# Patient Record
Sex: Female | Born: 1980 | Race: Black or African American | Hispanic: No | Smoking: Never smoker
Health system: Southern US, Community
[De-identification: ages and names within clinical notes are randomized; demographics above are authoritative.]

## PROBLEM LIST (undated history)

## (undated) DIAGNOSIS — M2550 Pain in unspecified joint: Secondary | ICD-10-CM

## (undated) DIAGNOSIS — K219 Gastro-esophageal reflux disease without esophagitis: Secondary | ICD-10-CM

## (undated) DIAGNOSIS — R7303 Prediabetes: Secondary | ICD-10-CM

## (undated) DIAGNOSIS — M549 Dorsalgia, unspecified: Secondary | ICD-10-CM

## (undated) DIAGNOSIS — E785 Hyperlipidemia, unspecified: Secondary | ICD-10-CM

## (undated) DIAGNOSIS — D649 Anemia, unspecified: Secondary | ICD-10-CM

## (undated) DIAGNOSIS — E669 Obesity, unspecified: Secondary | ICD-10-CM

## (undated) DIAGNOSIS — K920 Hematemesis: Secondary | ICD-10-CM

## (undated) DIAGNOSIS — R002 Palpitations: Secondary | ICD-10-CM

## (undated) DIAGNOSIS — R1084 Generalized abdominal pain: Secondary | ICD-10-CM

## (undated) DIAGNOSIS — G4700 Insomnia, unspecified: Secondary | ICD-10-CM

## (undated) HISTORY — DX: Dorsalgia, unspecified: M54.9

## (undated) HISTORY — DX: Hematemesis: K92.0

## (undated) HISTORY — DX: Prediabetes: R73.03

## (undated) HISTORY — DX: Insomnia, unspecified: G47.00

## (undated) HISTORY — DX: Hyperlipidemia, unspecified: E78.5

## (undated) HISTORY — DX: Anemia, unspecified: D64.9

## (undated) HISTORY — DX: Pain in unspecified joint: M25.50

## (undated) HISTORY — DX: Generalized abdominal pain: R10.84

## (undated) HISTORY — DX: Palpitations: R00.2

## (undated) HISTORY — DX: Obesity, unspecified: E66.9

## (undated) HISTORY — PX: CYST REMOVAL HAND: SHX6279

---
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
## 1999-10-13 ENCOUNTER — Other Ambulatory Visit: Admission: RE | Admit: 1999-10-13 | Discharge: 1999-10-13 | Payer: Self-pay | Admitting: *Deleted

## 1999-11-09 ENCOUNTER — Other Ambulatory Visit: Admission: RE | Admit: 1999-11-09 | Discharge: 1999-11-09 | Payer: Self-pay | Admitting: *Deleted

## 1999-11-10 ENCOUNTER — Other Ambulatory Visit: Admission: RE | Admit: 1999-11-10 | Discharge: 1999-11-10 | Payer: Self-pay | Admitting: Obstetrics and Gynecology

## 1999-11-10 ENCOUNTER — Encounter (INDEPENDENT_AMBULATORY_CARE_PROVIDER_SITE_OTHER): Payer: Self-pay

## 1999-11-13 ENCOUNTER — Emergency Department (HOSPITAL_COMMUNITY): Admission: EM | Admit: 1999-11-13 | Discharge: 1999-11-14 | Payer: Self-pay | Admitting: Emergency Medicine

## 2000-10-31 ENCOUNTER — Other Ambulatory Visit: Admission: RE | Admit: 2000-10-31 | Discharge: 2000-10-31 | Payer: Self-pay | Admitting: Obstetrics and Gynecology

## 2002-08-29 ENCOUNTER — Other Ambulatory Visit: Admission: RE | Admit: 2002-08-29 | Discharge: 2002-08-29 | Payer: Self-pay | Admitting: Obstetrics and Gynecology

## 2005-04-19 ENCOUNTER — Encounter: Admission: RE | Admit: 2005-04-19 | Discharge: 2005-04-19 | Payer: Self-pay | Admitting: Internal Medicine

## 2005-12-13 ENCOUNTER — Ambulatory Visit: Payer: Self-pay | Admitting: Pulmonary Disease

## 2007-02-24 ENCOUNTER — Encounter: Admission: RE | Admit: 2007-02-24 | Discharge: 2007-02-24 | Payer: Self-pay | Admitting: Internal Medicine

## 2009-05-07 ENCOUNTER — Inpatient Hospital Stay (HOSPITAL_COMMUNITY): Admission: AD | Admit: 2009-05-07 | Discharge: 2009-05-10 | Payer: Self-pay | Admitting: Obstetrics and Gynecology

## 2009-12-09 ENCOUNTER — Inpatient Hospital Stay (HOSPITAL_COMMUNITY): Admission: AD | Admit: 2009-12-09 | Discharge: 2009-05-07 | Payer: Self-pay | Admitting: Obstetrics and Gynecology

## 2010-03-22 LAB — COMPREHENSIVE METABOLIC PANEL
ALT: 16 U/L (ref 0–35)
ALT: 19 U/L (ref 0–35)
AST: 22 U/L (ref 0–37)
AST: 23 U/L (ref 0–37)
Albumin: 1.8 g/dL — ABNORMAL LOW (ref 3.5–5.2)
Albumin: 2.6 g/dL — ABNORMAL LOW (ref 3.5–5.2)
Alkaline Phosphatase: 153 U/L — ABNORMAL HIGH (ref 39–117)
Alkaline Phosphatase: 91 U/L (ref 39–117)
BUN: 10 mg/dL (ref 6–23)
BUN: 11 mg/dL (ref 6–23)
CO2: 21 mEq/L (ref 19–32)
CO2: 22 mEq/L (ref 19–32)
Calcium: 8.1 mg/dL — ABNORMAL LOW (ref 8.4–10.5)
Calcium: 9.4 mg/dL (ref 8.4–10.5)
Chloride: 105 mEq/L (ref 96–112)
Chloride: 107 mEq/L (ref 96–112)
Creatinine, Ser: 0.75 mg/dL (ref 0.4–1.2)
Creatinine, Ser: 1.03 mg/dL (ref 0.4–1.2)
GFR calc Af Amer: >60 mL/min (ref >60)
GFR calc Af Amer: >60 mL/min (ref >60)
GFR calc non Af Amer: >60 mL/min (ref >60)
GFR calc non Af Amer: >60 mL/min (ref >60)
Glucose, Bld: 87 mg/dL (ref 70–99)
Glucose, Bld: 93 mg/dL (ref 70–99)
Potassium: 4.2 mEq/L (ref 3.5–5.1)
Potassium: 4.3 mEq/L (ref 3.5–5.1)
Sodium: 134 mEq/L — ABNORMAL LOW (ref 135–145)
Sodium: 136 mEq/L (ref 135–145)
Total Bilirubin: 0.2 mg/dL — ABNORMAL LOW (ref 0.3–1.2)
Total Bilirubin: 0.3 mg/dL (ref 0.3–1.2)
Total Protein: 4.8 g/dL — ABNORMAL LOW (ref 6.0–8.3)
Total Protein: 6.6 g/dL (ref 6.0–8.3)

## 2010-03-22 LAB — CBC
HCT: 19.7 % — ABNORMAL LOW (ref 36.0–46.0)
HCT: 21.9 % — ABNORMAL LOW (ref 36.0–46.0)
HCT: 33.2 % — ABNORMAL LOW (ref 36.0–46.0)
HCT: 36 % (ref 36.0–46.0)
Hemoglobin: 11.2 g/dL — ABNORMAL LOW (ref 12.0–15.0)
Hemoglobin: 12 g/dL (ref 12.0–15.0)
Hemoglobin: 6.7 g/dL — CL (ref 12.0–15.0)
Hemoglobin: 7.3 g/dL — ABNORMAL LOW (ref 12.0–15.0)
MCHC: 33.2 g/dL (ref 30.0–36.0)
MCHC: 33.5 g/dL (ref 30.0–36.0)
MCHC: 33.7 g/dL (ref 30.0–36.0)
MCHC: 33.8 g/dL (ref 30.0–36.0)
MCV: 83.1 fL (ref 78.0–100.0)
MCV: 84 fL (ref 78.0–100.0)
MCV: 84.3 fL (ref 78.0–100.0)
MCV: 84.9 fL (ref 78.0–100.0)
Platelets: 225 10*3/uL (ref 150–400)
Platelets: 272 10*3/uL (ref 150–400)
Platelets: 313 10*3/uL (ref 150–400)
Platelets: 332 10*3/uL (ref 150–400)
RBC: 2.32 MIL/uL — ABNORMAL LOW (ref 3.87–5.11)
RBC: 2.61 MIL/uL — ABNORMAL LOW (ref 3.87–5.11)
RBC: 4 MIL/uL (ref 3.87–5.11)
RBC: 4.27 MIL/uL (ref 3.87–5.11)
RDW: 17.4 % — ABNORMAL HIGH (ref 11.5–15.5)
RDW: 17.6 % — ABNORMAL HIGH (ref 11.5–15.5)
RDW: 17.8 % — ABNORMAL HIGH (ref 11.5–15.5)
RDW: 18 % — ABNORMAL HIGH (ref 11.5–15.5)
WBC: 10.5 10*3/uL (ref 4.0–10.5)
WBC: 12.5 10*3/uL — ABNORMAL HIGH (ref 4.0–10.5)
WBC: 16.9 10*3/uL — ABNORMAL HIGH (ref 4.0–10.5)
WBC: 18.3 10*3/uL — ABNORMAL HIGH (ref 4.0–10.5)

## 2010-03-22 LAB — LACTATE DEHYDROGENASE
LDH: 149 U/L (ref 94–250)
LDH: 181 U/L (ref 94–250)

## 2010-03-22 LAB — URIC ACID
Uric Acid, Serum: 6.8 mg/dL (ref 2.4–7.0)
Uric Acid, Serum: 7.2 mg/dL — ABNORMAL HIGH (ref 2.4–7.0)

## 2010-03-22 LAB — RPR: RPR Ser Ql: NONREACTIVE

## 2010-03-22 LAB — CCBB MATERNAL DONOR DRAW

## 2010-05-20 NOTE — Assessment & Plan Note (Signed)
[facility]                             PULMONARY OFFICE NOTE   Megan Hanna, Megan Hanna                   MRN:          [id number]  DATE:12/13/2005                            DOB:          March 25, 1980    HISTORY OF PRESENT ILLNESS:  The patient is a 30 year old female who I  have been asked to see for possible obstructive sleep apnea. The patient  states that she has had intermittent spells where she will realize that  she is not breathing and will awaken but feels that she can not  physically move. This occurs very infrequently but occasionally can be 2  times a week. She has now gotten to the point where she is scared to  fall asleep. Patient typically gets to bed between 11 and 12:30 and gets  up at 8 a.m. to start her day. For the last 2 months she feels that she  has not been rested upon arising. She has been noted to have occasional  snoring but no one has mentioned pauses in her breathing during sleep.  Patient works as a Veterinary surgeon and does note sleep pressure during the  day. She has decreased concentration, and efficiency at work. She has  had some dozing with [employer] and movies but not difficulties driving. Of  note her weight is up about 50 pounds over the last 2 years.   PAST MEDICAL HISTORY:  Totally unremarkable. Patient takes only birth  control pills and has no known drug allergies.   SOCIAL HISTORY:  She is single and does not have children. She has never  smoked.   FAMILY HISTORY:  Remarkable only for heart disease.   REVIEW OF SYSTEMS:  As per present illness, also see patient intake form  documented in the chart.   PHYSICAL EXAMINATION:  IN GENERAL: She is an overweight female in no  acute distress. Blood pressure 118/70, pulse 77, temperature 99, weight  198 pounds, O2 saturation on room air is 93%.  HEENT: Pupils equal, round, and reactive to light, and accommodation.  Extraocular muscles intact. Nares are patent without  discharge.  OROPHARYNX: Shows no significant elongation of the soft palate or uvula,  otherwise it is clear.  NECK: Supple without JVD, lymphadenopathy. There is no palpable  thyromegaly.  CHEST: Totally clear.  CARDIAC EXAM: Reveals regular rate and rhythm, no murmurs, rubs, or  gallops.  ABDOMEN: Soft, nontender with good bowel sounds.  GENITALIA/RECTAL/BREAST EXAM: Not done and not indicated.  Lower extremities were without edema, good pulse distally. No calf  tenderness.  NEUROLOGICALLY: Alert and oriented with no obvious motor deficits.   IMPRESSION:  Questionable obstructive sleep apnea. The patient sounds as  if she has a possible apneic episode which probably arouses her from  rapid eye movement and results in sleep paralysis. She certainly has  daytime symptoms of being unrested and is obese and therefore may have  obstructive sleep apnea. I really think at this point in time it would  be worthwhile to do a sleep study.   PLAN:  1. Schedule for nocturnal polysomnogram.  2. Would suggest thyroid  function testing if this has not been done. I      will leave that to her primary care physician. The patient will      followup after the above.     Barbaraann Share, MD,FCCP  Electronically Signed    [facility]  DD: 01/17/2006  DT: 01/17/2006  Job #: [id number]   cc:   Luanna Cole. Lenord Fellers, M.D.

## 2013-09-29 ENCOUNTER — Ambulatory Visit: Payer: BC Managed Care – PPO

## 2015-11-02 ENCOUNTER — Encounter (HOSPITAL_COMMUNITY): Payer: Self-pay | Admitting: *Deleted

## 2015-11-02 ENCOUNTER — Ambulatory Visit (HOSPITAL_COMMUNITY)
Admission: EM | Admit: 2015-11-02 | Discharge: 2015-11-02 | Disposition: A | Discharge status: Home / Self Care | Payer: 59 | Attending: Family Medicine | Admitting: Family Medicine

## 2015-11-02 DIAGNOSIS — K2100 Gastro-esophageal reflux disease with esophagitis, without bleeding: Secondary | ICD-10-CM

## 2015-11-02 DIAGNOSIS — K449 Diaphragmatic hernia without obstruction or gangrene: Secondary | ICD-10-CM | POA: Diagnosis not present

## 2015-11-02 HISTORY — DX: Gastro-esophageal reflux disease without esophagitis: K21.9

## 2015-11-02 LAB — POCT PREGNANCY, URINE: Preg Test, Ur: NEGATIVE

## 2015-11-02 MED ORDER — GI COCKTAIL ~~LOC~~
30.0000 mL | Freq: Once | ORAL | Status: AC
  Administered 2015-11-02: 30 mL via ORAL

## 2015-11-02 MED ORDER — GI COCKTAIL ~~LOC~~
ORAL | Status: AC
  Filled 2015-11-02: qty 30

## 2015-11-02 MED ORDER — OMEPRAZOLE 20 MG PO CPDR: 20.0000 mg | DELAYED_RELEASE_CAPSULE | Freq: Every day | ORAL | 1 refills | Status: DC

## 2015-11-02 MED ORDER — HYOSCYAMINE SULFATE SL 0.125 MG SL SUBL: 1.0000 | SUBLINGUAL_TABLET | Freq: Three times a day (TID) | SUBLINGUAL | 0 refills | Status: DC | PRN

## 2015-11-02 NOTE — ED Triage Notes (Signed)
Patient reports chest pain and sob of breath, constant in nature since Friday. States it feels like someone is sitting on her chest. Patient reports headache last night. Denies any diaphoresis. Sates she has felt nauseas.

## 2015-11-02 NOTE — ED Provider Notes (Signed)
[facility]    CSN: [id number] Arrival date & time: 11/02/15  1203     History   Chief Complaint Chief Complaint  Patient presents with  . Chest Pain  . Shortness of Breath    HPI Megan Hanna is a 35 y.o. female.   This 35 year old woman who for the last 4 days is had substernal pressure that is been continuous. It is been associated with some nausea, but no diaphoresis or fever. Her symptoms are very mild at present. They do not radiate to her back.  Patient's a nonsmoker and does not have hypertension. Megan Hanna has no history of diabetes. Megan Hanna has had some mild aching in her legs but no point tenderness, swelling, or difficulty walking. Megan Hanna has no rash.  Patient has had GERD in the past but it was never associated with this much pressure on the sensation of tightness in her chest.  Patient has a history of hiatal hernia.  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      Past Medical History:  Diagnosis Date  . GERD (gastroesophageal reflux disease)     There are no active problems to display for this patient.   History reviewed. No pertinent surgical history.  OB History    No data available       Home Medications    Prior to Admission medications   Medication Sig Start Date End Date Taking? Authorizing Provider  Hyoscyamine Sulfate SL (LEVSIN/SL) 0.125 MG SUBL Place 1 tablet under the tongue 3 (three) times daily as needed. 11/02/15   Elvina SidleKurt Delphin Funes, MD  omeprazole (PRILOSEC) 20 MG capsule Take 1 capsule (20 mg total) by mouth daily. 11/02/15   Elvina SidleKurt Aleksis Jiggetts, MD    Family History History reviewed. No pertinent family history.  Social History Social History  Substance Use Topics  . Smoking status: Never Smoker  . Smokeless tobacco: Not on file  . Alcohol use No     Allergies   Review of patient's allergies indicates no known allergies.   Review of Systems Review of Systems  Constitutional: Negative.     HENT: Negative.   Eyes: Negative.   Respiratory: Positive for chest tightness.   Cardiovascular: Positive for chest pain.  Gastrointestinal: Positive for nausea. Negative for vomiting.     Physical Exam Triage Vital Signs ED Triage Vitals  Enc Vitals Group     BP 11/02/15 1220 114/76     Pulse Rate 11/02/15 1220 70     Resp 11/02/15 1220 18     Temp 11/02/15 1220 98 F (36.7 C)     Temp Source 11/02/15 1220 Oral     SpO2 11/02/15 1220 100 %     Weight --      Height --      Head Circumference --      Peak Flow --      Pain Score 11/02/15 1219 7     Pain Loc --      Pain Edu? --      Excl. in GC? --    No data found.   Updated Vital Signs BP 114/76 (BP Location: Right Arm)   Pulse 70   Temp 98 F (36.7 C) (Oral)   Resp 18   LMP 10/03/2015   SpO2 100%    Physical Exam  Constitutional: Megan Hanna is oriented to person, place, and time. Megan Hanna appears well-developed and well-nourished.  HENT:  Head: Normocephalic.  Right Ear: External ear normal.  Left  Ear: External ear normal.  Mouth/Throat: Oropharynx is clear and moist.  Eyes: Conjunctivae and EOM are normal. Pupils are equal, round, and reactive to light.  Neck: Normal range of motion. Neck supple.  Cardiovascular: Normal rate, regular rhythm and normal heart sounds.   Pulmonary/Chest: Effort normal and breath sounds normal.  Abdominal: Soft. Bowel sounds are normal. There is no tenderness.  Musculoskeletal: Normal range of motion.  Neurological: Megan Hanna is alert and oriented to person, place, and time. No cranial nerve deficit.  Skin: Skin is warm and dry.  Nursing note and vitals reviewed.    UC Treatments / Results  Labs (all labs ordered are listed, but only abnormal results are displayed) Labs Reviewed  POCT PREGNANCY, URINE    EKG  EKG Interpretation None       Radiology No results found.  Procedures ED EKG Date/Time: 11/02/2015 12:30 PM Performed by: Elvina SidleLAUENSTEIN, Attila Mccarthy Authorized by:  Elvina SidleLAUENSTEIN, Averyanna Sax   ECG reviewed by ED Physician in the absence of a cardiologist: yes   Previous ECG:    Previous ECG:  Unavailable Interpretation:    Interpretation: normal   Rate:    ECG rate assessment: normal   Rhythm:    Rhythm: sinus rhythm   Ectopy:    Ectopy: none   QRS:    QRS axis:  Normal Conduction:    Conduction: normal   ST segments:    ST segments:  Normal T waves:    T waves: normal   Other findings:    Other findings: poor R wave progression     (including critical care time)  Medications Ordered in UC Medications  gi cocktail (Maalox,Lidocaine,Donnatal) (30 mLs Oral Given 11/02/15 1256)     Initial Impression / Assessment and Plan / UC Course  I have reviewed the triage vital signs and the nursing notes.  Pertinent labs & imaging results that were available during my care of the patient were reviewed by me and considered in my medical decision making (see chart for details).  Clinical Course   After given GI cocktail, patient has significant improvement. Megan Hanna still had some discomfort.  Final Clinical Impressions(s) / UC Diagnoses   Final diagnoses:  Gastroesophageal reflux disease with esophagitis  Hiatal hernia    New Prescriptions New Prescriptions   HYOSCYAMINE SULFATE SL (LEVSIN/SL) 0.125 MG SUBL    Place 1 tablet under the tongue 3 (three) times daily as needed.   OMEPRAZOLE (PRILOSEC) 20 MG CAPSULE    Take 1 capsule (20 mg total) by mouth daily.     Elvina SidleKurt Retal Tonkinson, MD 11/02/15 [phone]

## 2016-01-17 DIAGNOSIS — K920 Hematemesis: Secondary | ICD-10-CM | POA: Diagnosis not present

## 2016-01-17 DIAGNOSIS — A084 Viral intestinal infection, unspecified: Secondary | ICD-10-CM | POA: Diagnosis not present

## 2016-03-10 DIAGNOSIS — R110 Nausea: Secondary | ICD-10-CM | POA: Diagnosis not present

## 2016-03-10 DIAGNOSIS — R1084 Generalized abdominal pain: Secondary | ICD-10-CM | POA: Diagnosis not present

## 2016-03-10 DIAGNOSIS — K920 Hematemesis: Secondary | ICD-10-CM | POA: Diagnosis not present

## 2016-03-22 DIAGNOSIS — E611 Iron deficiency: Secondary | ICD-10-CM | POA: Diagnosis not present

## 2016-03-22 DIAGNOSIS — E538 Deficiency of other specified B group vitamins: Secondary | ICD-10-CM | POA: Diagnosis not present

## 2016-03-22 DIAGNOSIS — R110 Nausea: Secondary | ICD-10-CM | POA: Diagnosis not present

## 2016-03-22 DIAGNOSIS — R1084 Generalized abdominal pain: Secondary | ICD-10-CM | POA: Diagnosis not present

## 2016-03-24 DIAGNOSIS — K2930 Chronic superficial gastritis without bleeding: Secondary | ICD-10-CM | POA: Diagnosis not present

## 2016-03-24 DIAGNOSIS — R110 Nausea: Secondary | ICD-10-CM | POA: Diagnosis not present

## 2016-03-30 ENCOUNTER — Other Ambulatory Visit: Payer: Self-pay | Admitting: Family Medicine

## 2016-03-30 DIAGNOSIS — R1011 Right upper quadrant pain: Secondary | ICD-10-CM

## 2016-03-31 ENCOUNTER — Other Ambulatory Visit: Payer: Self-pay | Admitting: Family Medicine

## 2016-03-31 DIAGNOSIS — G4459 Other complicated headache syndrome: Secondary | ICD-10-CM

## 2016-04-06 ENCOUNTER — Ambulatory Visit
Admission: RE | Admit: 2016-04-06 | Discharge: 2016-04-06 | Disposition: A | Discharge status: Home / Self Care | Payer: 59 | Source: Ambulatory Visit | Attending: Family Medicine | Admitting: Family Medicine

## 2016-04-06 DIAGNOSIS — R1011 Right upper quadrant pain: Secondary | ICD-10-CM | POA: Diagnosis not present

## 2016-04-12 ENCOUNTER — Other Ambulatory Visit (HOSPITAL_COMMUNITY): Payer: Self-pay | Admitting: Family Medicine

## 2016-04-12 DIAGNOSIS — R1011 Right upper quadrant pain: Secondary | ICD-10-CM

## 2016-04-14 ENCOUNTER — Ambulatory Visit
Admission: RE | Admit: 2016-04-14 | Discharge: 2016-04-14 | Disposition: A | Discharge status: Home / Self Care | Payer: 59 | Source: Ambulatory Visit | Attending: Family Medicine | Admitting: Family Medicine

## 2016-04-14 ENCOUNTER — Encounter: Payer: Self-pay | Admitting: Radiology

## 2016-04-14 DIAGNOSIS — G4459 Other complicated headache syndrome: Secondary | ICD-10-CM

## 2016-04-14 DIAGNOSIS — R51 Headache: Secondary | ICD-10-CM | POA: Diagnosis not present

## 2016-04-14 MED ORDER — GADOBENATE DIMEGLUMINE 529 MG/ML IV SOLN
20.0000 mL | Freq: Once | INTRAVENOUS | Status: AC | PRN
  Administered 2016-04-14: 20 mL via INTRAVENOUS

## 2016-04-18 ENCOUNTER — Telehealth: Payer: Self-pay | Admitting: *Deleted

## 2016-04-19 ENCOUNTER — Encounter (HOSPITAL_COMMUNITY): Payer: Self-pay

## 2016-04-19 ENCOUNTER — Ambulatory Visit (HOSPITAL_COMMUNITY): Payer: 59

## 2016-04-25 ENCOUNTER — Ambulatory Visit (HOSPITAL_COMMUNITY)
Admission: RE | Admit: 2016-04-25 | Discharge: 2016-04-25 | Disposition: A | Discharge status: Home / Self Care | Payer: 59 | Source: Ambulatory Visit | Attending: Family Medicine | Admitting: Family Medicine

## 2016-04-25 DIAGNOSIS — R1011 Right upper quadrant pain: Secondary | ICD-10-CM | POA: Diagnosis not present

## 2016-04-25 DIAGNOSIS — R110 Nausea: Secondary | ICD-10-CM | POA: Diagnosis not present

## 2016-04-25 MED ORDER — TECHNETIUM TC 99M MEBROFENIN IV KIT
5.0000 | PACK | Freq: Once | INTRAVENOUS | Status: AC | PRN
  Administered 2016-04-25: 5 via INTRAVENOUS

## 2016-04-26 DIAGNOSIS — E785 Hyperlipidemia, unspecified: Secondary | ICD-10-CM | POA: Diagnosis not present

## 2016-04-26 DIAGNOSIS — G4700 Insomnia, unspecified: Secondary | ICD-10-CM | POA: Diagnosis not present

## 2016-04-26 DIAGNOSIS — Z0000 Encounter for general adult medical examination without abnormal findings: Secondary | ICD-10-CM | POA: Diagnosis not present

## 2016-11-02 DIAGNOSIS — R002 Palpitations: Secondary | ICD-10-CM | POA: Diagnosis not present

## 2016-11-03 ENCOUNTER — Telehealth: Payer: Self-pay | Admitting: *Deleted

## 2016-11-03 DIAGNOSIS — R002 Palpitations: Secondary | ICD-10-CM | POA: Diagnosis not present

## 2016-11-03 NOTE — Telephone Encounter (Signed)
NOTES SENT TO SCHEDULING.  °

## 2016-11-06 DIAGNOSIS — R002 Palpitations: Secondary | ICD-10-CM

## 2016-11-06 HISTORY — DX: Palpitations: R00.2

## 2016-11-06 NOTE — Progress Notes (Signed)
Cardiology Office Note:    Date:  11/08/2016   ID:  Megan Hanna, DOB 07-01-1980, MRN [id number]  PCP:  Shirlean Mylar, MD  Cardiologist:  Norman Herrlich, MD   Referring MD: Shirlean Mylar, MD  ASSESSMENT:    1. Palpitation   2. Chest pain in adult    PLAN:    In order of problems listed above:  1. Her symptoms are quite suggestive of ventricular premature contractions.  With associated shortness of breath and palpitation she needs evaluation for cardiomyopathy as well as CAD and will undergo a stress echo as well as a Holter monitor with frequent symptoms to quantitate and typify her arrhythmia.  We discussed beta-blocker therapy if we document frequent symptomatic PVCs.  There is no obvious precipitant and I do not feel as if I need to repeat her blood test. 2. To undergo stress test stress echo  Next appointment 3 weeks   Medication Adjustments/Labs and Tests Ordered: Current medicines are reviewed at length with the patient today.  Concerns regarding medicines are outlined above.  Orders Placed This Encounter  Procedures  . Holter monitor - 48 hour  . ECHOCARDIOGRAM STRESS TEST   No orders of the defined types were placed in this encounter.    Chief Complaint  Patient presents with  . Palpitations    History of Present Illness:    Megan Hanna is a 36 y.o. female who is being seen today for the evaluation of palpitation and  chest pain at the request of Shirlean Mylar, MD. She had the onset of palpitations approximately 2-1/2 weeks ago.  She has episodes that occur daily lasting up to 5-15 minutes or pulse feels forceful she is breathless pleural moment has been a chest tightness.  The episodes occur at rest not with exertion occur throughout the day but more frequently in the evening they do not awaken her from her sleep and she does not feel as if her pulse is particularly chaotic a rapid just forceful.  She takes no proarrhythmic over-the-counter medications and  does not feel particularly under stress at this time.  She has not had no exercise intolerance exertional chest pain syncope and has no known history of congenital or rheumatic heart disease.  Her only medication is trazodone for sleep and she takes once every few months and no longer takes a PPI and no longer has GERD symptoms since assuming a vegetarian diet  Past Medical History:  Diagnosis Date  . Generalized abdominal pain 11/07/2016  . GERD (gastroesophageal reflux disease)   . Hematemesis with nausea 11/07/2016  . Hyperlipidemia 11/07/2016  . Insomnia 11/07/2016  . Obesity 11/07/2016  . Palpitation 11/06/2016    Past Surgical History:  Procedure Laterality Date  . CYST REMOVAL HAND Left     Current Medications: No outpatient medications have been marked as taking for the 11/08/16 encounter (Office Visit) with Baldo Daub, MD.     Allergies:   Patient has no known allergies.   Social History   Socioeconomic History  . Marital status: Married    Spouse name: None  . Number of children: None  . Years of education: None  . Highest education level: None  Social Needs  . Financial resource strain: None  . Food insecurity - worry: None  . Food insecurity - inability: None  . Transportation needs - medical: None  . Transportation needs - non-medical: None  Occupational History  . None  Tobacco Use  . Smoking status: Never Smoker  .  Smokeless tobacco: Never Used  Substance and Sexual Activity  . Alcohol use: Yes    Comment: occasional wine  . Drug use: None  . Sexual activity: None  Other Topics Concern  . None  Social History Narrative  . None     Family History: The patient's family history includes Breast cancer in her maternal grandmother; Diabetes in her father; Heart attack in her maternal grandfather; Heart disease in her maternal grandfather and paternal grandfather; Hypertension in her father and mother.  ROS:   Review of Systems  Constitution: Negative.   HENT: Negative.   Eyes: Negative.   Cardiovascular: Positive for chest pain (vague tightness substernal associated with palpitation) and palpitations. Negative for claudication, cyanosis, dyspnea on exertion, irregular heartbeat, leg swelling, near-syncope, orthopnea and paroxysmal nocturnal dyspnea.  Respiratory: Positive for shortness of breath (with palpitation). Negative for cough, hemoptysis, sleep disturbances due to breathing, snoring, sputum production and wheezing.   Endocrine: Negative.   Hematologic/Lymphatic: Negative.   Skin: Negative.   Musculoskeletal: Negative.   Gastrointestinal: Negative.   Genitourinary: Negative.   Neurological: Negative.   Psychiatric/Behavioral: Negative.   Allergic/Immunologic: Negative.    Please see the history of present illness.     All other systems reviewed and are negative.  EKGs/Labs/Other Studies Reviewed:    The following studies were reviewed today: I reviewed her PCP office note EKG and labs prior to visit  EKG:  EKG 11/02/16 sinus rhythm normal EKG QT interval is normal  Recent Labs: 03/29/16: CBC is normal, ferritin low at 74, B12 low at 178, CMP normal K 4.5 11/03/16 CBC is normal, No results found for requested labs within last 8760 hours.  Recent Lipid Panel 09/21/14 Chol 209 HDL 52 LDL143 No results found for: CHOL, TRIG, HDL, CHOLHDL, VLDL, LDLCALC, LDLDIRECT  Physical Exam:    VS:  BP 90/78 (BP Location: Left Arm, Patient Position: Sitting, Cuff Size: Large)   Pulse 87   Ht 5\' 7"  (1.702 m)   Wt 232 lb (105.2 kg)   SpO2 98%   BMI 36.34 kg/m     Wt Readings from Last 3 Encounters:  11/08/16 232 lb (105.2 kg)     GEN:  Well nourished, well developed in no acute distress HEENT: Normal NECK: No JVD; No carotid bruits LYMPHATICS: No lymphadenopathy CARDIAC: RRR, no murmurs, rubs, gallops RESPIRATORY:  Clear to auscultation without rales, wheezing or rhonchi  ABDOMEN: Soft, non-tender,  non-distended MUSCULOSKELETAL:  No edema; No deformity  SKIN: Warm and dry NEUROLOGIC:  Alert and oriented x 3 PSYCHIATRIC:  Normal affect     Signed, Norman HerrlichBrian Munley, MD  11/08/2016 12:51 PM    [facility]

## 2016-11-07 DIAGNOSIS — R1084 Generalized abdominal pain: Secondary | ICD-10-CM

## 2016-11-07 DIAGNOSIS — E785 Hyperlipidemia, unspecified: Secondary | ICD-10-CM

## 2016-11-07 DIAGNOSIS — G4700 Insomnia, unspecified: Secondary | ICD-10-CM

## 2016-11-07 DIAGNOSIS — E669 Obesity, unspecified: Secondary | ICD-10-CM

## 2016-11-07 DIAGNOSIS — K920 Hematemesis: Secondary | ICD-10-CM

## 2016-11-07 DIAGNOSIS — K219 Gastro-esophageal reflux disease without esophagitis: Secondary | ICD-10-CM | POA: Insufficient documentation

## 2016-11-07 HISTORY — DX: Obesity, unspecified: E66.9

## 2016-11-07 HISTORY — DX: Generalized abdominal pain: R10.84

## 2016-11-07 HISTORY — DX: Insomnia, unspecified: G47.00

## 2016-11-07 HISTORY — DX: Hematemesis: K92.0

## 2016-11-07 HISTORY — DX: Hyperlipidemia, unspecified: E78.5

## 2016-11-08 ENCOUNTER — Ambulatory Visit: Payer: 59 | Admitting: Cardiology

## 2016-11-08 ENCOUNTER — Encounter: Payer: Self-pay | Admitting: Cardiology

## 2016-11-08 DIAGNOSIS — R079 Chest pain, unspecified: Secondary | ICD-10-CM

## 2016-11-08 DIAGNOSIS — R002 Palpitations: Secondary | ICD-10-CM | POA: Diagnosis not present

## 2016-11-08 NOTE — Patient Instructions (Addendum)
Medication Instructions:  Your physician has recommended you make the following change in your medication:  START multivitamin with iron  Labwork: None  Testing/Procedures: Your physician has recommended that you wear a holter monitor. Holter monitors are medical devices that record the heart's electrical activity. Doctors most often use these monitors to diagnose arrhythmias. Arrhythmias are problems with the speed or rhythm of the heartbeat. The monitor is a small, portable device. You can wear one while you do your normal daily activities. This is usually used to diagnose what is causing palpitations/syncope (passing out). 48 hours.  Your physician has requested that you have a stress echocardiogram. For further information please visit [email or web address]. Please follow instruction sheet as given.  Follow-Up: Your physician recommends that you schedule a follow-up appointment in: 3 weeks.  Any Other Special Instructions Will Be Listed Below (If Applicable).     If you need a refill on your cardiac medications before your next appointment, please call your pharmacy.    1. Avoid all over-the-counter antihistamines except Claritin/Loratadine and Zyrtec/Cetrizine. 2. Avoid all combination including cold sinus allergies flu decongestant and sleep medications 3. You can use Robitussin DM Mucinex and Mucinex DM for cough. 4. can use Tylenol aspirin ibuprofen and naproxen but no combinations such as sleep or sinus.

## 2016-11-13 ENCOUNTER — Ambulatory Visit: Payer: 59

## 2016-11-13 DIAGNOSIS — R002 Palpitations: Secondary | ICD-10-CM | POA: Diagnosis not present

## 2016-11-13 DIAGNOSIS — R079 Chest pain, unspecified: Secondary | ICD-10-CM | POA: Diagnosis not present

## 2016-11-30 ENCOUNTER — Ambulatory Visit (HOSPITAL_BASED_OUTPATIENT_CLINIC_OR_DEPARTMENT_OTHER): Payer: 59 | Attending: Cardiology

## 2016-12-01 ENCOUNTER — Ambulatory Visit: Payer: 59 | Admitting: Cardiology

## 2016-12-04 NOTE — Progress Notes (Deleted)
Cardiology Office Note:    Date:  12/04/2016   ID:  Megan Hanna, DOB 1980-09-27, MRN [id number]  PCP:  Shirlean MylarWebb, Carol, MD  Cardiologist:  Norman HerrlichBrian Avory Mimbs, MD    Referring MD: Shirlean MylarWebb, Carol, MD    ASSESSMENT:    No diagnosis found. PLAN:    In order of problems listed above:  1. ***   Next appointment: ***   Medication Adjustments/Labs and Tests Ordered: Current medicines are reviewed at length with the patient today.  Concerns regarding medicines are outlined above.  No orders of the defined types were placed in this encounter.  No orders of the defined types were placed in this encounter.   No chief complaint on file.   History of Present Illness:    Megan Hanna is a 36 y.o. female with a hx ofpalpitation and  chest pain last seen 1 month ago. 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 Compliance with diet, lifestyle and medications: *** Past Medical History:  Diagnosis Date  . Generalized abdominal pain 11/07/2016  . GERD (gastroesophageal reflux disease)   . Hematemesis with nausea 11/07/2016  . Hyperlipidemia 11/07/2016  . Insomnia 11/07/2016  . Obesity 11/07/2016  . Palpitation 11/06/2016    Past Surgical History:  Procedure Laterality Date  . CYST REMOVAL HAND Left     Current Medications: No outpatient medications have been marked as taking for the 12/05/16 encounter (Appointment) with Baldo DaubMunley, Olamide Carattini J, MD.     Allergies:   Patient has no known allergies.   Social History   Socioeconomic History  . Marital status: Divorced    Spouse name: Not on file  . Number of children: Not on file  . Years of education: Not on file  . Highest education level: Not on file  Social Needs  . Financial resource strain: Not on file  . Food insecurity - worry: Not on file  . Food insecurity - inability: Not on file  . Transportation needs - medical: Not on file  . Transportation needs - non-medical: Not on file    Occupational History  . Not on file  Tobacco Use  . Smoking status: Never Smoker  . Smokeless tobacco: Never Used  Substance and Sexual Activity  . Alcohol use: Yes    Comment: occasional wine  . Drug use: Not on file  . Sexual activity: Not on file  Other Topics Concern  . Not on file  Social History Narrative  . Not on file     Family History: The patient's ***family history includes Breast cancer in her maternal grandmother; Diabetes in her father; Heart attack in her maternal grandfather; Heart disease in her maternal grandfather and paternal grandfather; Hypertension in her father and mother. ROS:   Please see the history of present illness.    All other systems reviewed and are negative.  EKGs/Labs/Other Studies Reviewed:    The following studies were reviewed today:  EKG:  EKG ordered today.  The ekg ordered today demonstrates ***  Recent Labs: No results found for requested labs within last 8760 hours.  Recent Lipid Panel No results found for: CHOL, TRIG, HDL, CHOLHDL, VLDL, LDLCALC, LDLDIRECT  Physical Exam:    VS:  There were no vitals taken for this visit.    Wt Readings from Last 3 Encounters:  11/08/16 232 lb (105.2 kg)     GEN: *** Well nourished, well developed in no acute distress HEENT: Normal NECK: No JVD; No carotid bruits LYMPHATICS: No  lymphadenopathy CARDIAC: ***RRR, no murmurs, rubs, gallops RESPIRATORY:  Clear to auscultation without rales, wheezing or rhonchi  ABDOMEN: Soft, non-tender, non-distended MUSCULOSKELETAL:  No edema; No deformity  SKIN: Warm and dry NEUROLOGIC:  Alert and oriented x 3 PSYCHIATRIC:  Normal affect    Signed, Norman HerrlichBrian Jonthan Leite, MD  12/04/2016 8:44 AM    [facility]

## 2016-12-05 ENCOUNTER — Ambulatory Visit: Payer: 59 | Admitting: Cardiology

## 2017-04-20 DIAGNOSIS — M9902 Segmental and somatic dysfunction of thoracic region: Secondary | ICD-10-CM | POA: Diagnosis not present

## 2017-04-20 DIAGNOSIS — M9901 Segmental and somatic dysfunction of cervical region: Secondary | ICD-10-CM | POA: Diagnosis not present

## 2017-04-20 DIAGNOSIS — M9903 Segmental and somatic dysfunction of lumbar region: Secondary | ICD-10-CM | POA: Diagnosis not present

## 2017-04-23 DIAGNOSIS — M9902 Segmental and somatic dysfunction of thoracic region: Secondary | ICD-10-CM | POA: Diagnosis not present

## 2017-04-23 DIAGNOSIS — M9901 Segmental and somatic dysfunction of cervical region: Secondary | ICD-10-CM | POA: Diagnosis not present

## 2017-04-23 DIAGNOSIS — M9903 Segmental and somatic dysfunction of lumbar region: Secondary | ICD-10-CM | POA: Diagnosis not present

## 2017-04-26 ENCOUNTER — Other Ambulatory Visit: Payer: Self-pay

## 2017-04-26 ENCOUNTER — Ambulatory Visit (HOSPITAL_COMMUNITY)
Admission: EM | Admit: 2017-04-26 | Discharge: 2017-04-26 | Disposition: A | Discharge status: Home / Self Care | Payer: 59 | Attending: Internal Medicine | Admitting: Internal Medicine

## 2017-04-26 ENCOUNTER — Encounter (HOSPITAL_COMMUNITY): Payer: Self-pay | Admitting: Emergency Medicine

## 2017-04-26 DIAGNOSIS — J3489 Other specified disorders of nose and nasal sinuses: Secondary | ICD-10-CM

## 2017-04-26 DIAGNOSIS — M9902 Segmental and somatic dysfunction of thoracic region: Secondary | ICD-10-CM | POA: Diagnosis not present

## 2017-04-26 DIAGNOSIS — M9901 Segmental and somatic dysfunction of cervical region: Secondary | ICD-10-CM | POA: Diagnosis not present

## 2017-04-26 DIAGNOSIS — M9903 Segmental and somatic dysfunction of lumbar region: Secondary | ICD-10-CM | POA: Diagnosis not present

## 2017-04-26 DIAGNOSIS — R0981 Nasal congestion: Secondary | ICD-10-CM

## 2017-04-26 MED ORDER — PREDNISONE 20 MG PO TABS: 40.0000 mg | ORAL_TABLET | Freq: Every day | ORAL | 0 refills | Status: AC

## 2017-04-26 MED ORDER — BENZONATATE 100 MG PO CAPS: 100.0000 mg | ORAL_CAPSULE | Freq: Three times a day (TID) | ORAL | 0 refills | Status: DC

## 2017-04-26 MED ORDER — TRIAMCINOLONE ACETONIDE 55 MCG/ACT NA AERO: 2.0000 | INHALATION_SPRAY | Freq: Every day | NASAL | 12 refills | Status: DC

## 2017-04-26 NOTE — Discharge Instructions (Addendum)
Tessalon for cough. Continue allegra-D. Start nasacort for nasal congestion/drainage. You can use over the counter nasal saline rinse such as neti pot for nasal congestion. Keep hydrated, your urine should be clear to pale yellow in color. Tylenol/motrin for fever and pain. Monitor for any worsening of symptoms, chest pain, shortness of breath, wheezing, swelling of the throat, follow up for reevaluation.   If sinus pressure does not improve, you can fill prednisone to help with symptoms. As discussed, you can also use afrin to help with nasal congestion. Use sparingly, and do not use for moe than 3 days consecutively as it can cause rebound congestion.  For sore throat/cough try using a honey-based tea. Use 3 teaspoons of honey with juice squeezed from half lemon. Place shaved pieces of ginger into 1/2-1 cup of water and warm over stove top. Then mix the ingredients and repeat every 4 hours as needed.

## 2017-04-26 NOTE — ED Triage Notes (Signed)
Onset 2 days ago of symptoms.  Patient complains of tightness and burning in face, around eyes.  Head congestion, throat phlegm and laryngitis.  Overall, feeling very drained

## 2017-04-26 NOTE — ED Provider Notes (Signed)
[facility]    CSN: [id number] Arrival date & time: 04/26/17  1513     History   Chief Complaint Chief Complaint  Patient presents with  . URI    HPI Megan Hanna is a 37 y.o. female.   37 year old female comes in for 2-day history of URI symptoms.  She has had nasal congestion, rhinorrhea, burning sensation in nostril, sinus pressure. States has not been able to sleep due to nasal congestion. Cough. Denies fever, chills, night sweats. Endorses sneezing. otc mucinex, allegra, vics vapor, nyquil without relief. Never smoker.      Past Medical History:  Diagnosis Date  . Generalized abdominal pain 11/07/2016  . GERD (gastroesophageal reflux disease)   . Hematemesis with nausea 11/07/2016  . Hyperlipidemia 11/07/2016  . Insomnia 11/07/2016  . Obesity 11/07/2016  . Palpitation 11/06/2016    Patient Active Problem List   Diagnosis Date Noted  . Chest pain in adult 11/08/2016  . Hyperlipidemia 11/07/2016  . Insomnia 11/07/2016  . Obesity 11/07/2016  . Generalized abdominal pain 11/07/2016  . Hematemesis with nausea 11/07/2016  . GERD (gastroesophageal reflux disease)   . Palpitation 11/06/2016    Past Surgical History:  Procedure Laterality Date  . CYST REMOVAL HAND Left     OB History   None      Home Medications    Prior to Admission medications   Medication Sig Start Date End Date Taking? Authorizing Provider  fexofenadine-pseudoephedrine (ALLEGRA-D 24) 180-240 MG 24 hr tablet Take 1 tablet by mouth daily.   Yes [provider]  guaiFENesin (MUCINEX) 600 MG 12 hr tablet Take by mouth 2 (two) times daily.   Yes [provider]  benzonatate (TESSALON) 100 MG capsule Take 1 capsule (100 mg total) by mouth every 8 (eight) hours. 04/26/17   Cathie HoopsYu, Amy V, PA-C  predniSONE (DELTASONE) 20 MG tablet Take 2 tablets (40 mg total) by mouth daily for 5 days. 04/26/17 05/01/17  Cathie HoopsYu, Amy V, PA-C  triamcinolone (NASACORT) 55 MCG/ACT AERO nasal  inhaler Place 2 sprays into the nose daily. 04/26/17   Belinda FisherYu, Amy V, PA-C    Family History Family History  Problem Relation Age of Onset  . Hypertension Mother   . Diabetes Father   . Hypertension Father   . Breast cancer Maternal Grandmother        remission  . Heart disease Maternal Grandfather   . Heart attack Maternal Grandfather   . Heart disease Paternal Grandfather     Social History Social History   Tobacco Use  . Smoking status: Never Smoker  . Smokeless tobacco: Never Used  Substance Use Topics  . Alcohol use: Yes    Comment: occasional wine  . Drug use: Never     Allergies   Patient has no known allergies.   Review of Systems Review of Systems  Reason unable to perform ROS: See HPI as above.     Physical Exam Triage Vital Signs ED Triage Vitals  Enc Vitals Group     BP 04/26/17 1550 110/82     Pulse Rate 04/26/17 1550 90     Resp 04/26/17 1550 20     Temp 04/26/17 1550 98.2 F (36.8 C)     Temp Source 04/26/17 1550 Oral     SpO2 04/26/17 1550 99 %     Weight --      Height --      Head Circumference --      Peak Flow --  Pain Score 04/26/17 1549 7     Pain Loc --      Pain Edu? --      Excl. in GC? --    No data found.  Updated Vital Signs BP 110/82 (BP Location: Right Arm) Comment (BP Location): large cuff  Pulse 90   Temp 98.2 F (36.8 C) (Oral)   Resp 20   SpO2 99%   Physical Exam  Constitutional: She is oriented to person, place, and time. She appears well-developed and well-nourished. No distress.  HENT:  Head: Normocephalic and atraumatic.  Right Ear: External ear and ear canal normal. Tympanic membrane is erythematous. Tympanic membrane is not bulging.  Left Ear: External ear and ear canal normal. Tympanic membrane is erythematous. Tympanic membrane is not bulging.  Nose: Right sinus exhibits maxillary sinus tenderness and frontal sinus tenderness. Left sinus exhibits maxillary sinus tenderness and frontal sinus tenderness.   Mouth/Throat: Uvula is midline, oropharynx is clear and moist and mucous membranes are normal.  Eyes: Pupils are equal, round, and reactive to light. Conjunctivae are normal.  Neck: Normal range of motion. Neck supple.  Cardiovascular: Normal rate, regular rhythm and normal heart sounds. Exam reveals no gallop and no friction rub.  No murmur heard. Pulmonary/Chest: Effort normal and breath sounds normal. She has no decreased breath sounds. She has no wheezes. She has no rhonchi. She has no rales.  Lymphadenopathy:    She has no cervical adenopathy.  Neurological: She is alert and oriented to person, place, and time.  Skin: Skin is warm and dry.  Psychiatric: She has a normal mood and affect. Her behavior is normal. Judgment normal.     UC Treatments / Results  Labs (all labs ordered are listed, but only abnormal results are displayed) Labs Reviewed - No data to display  EKG None Radiology No results found.  Procedures Procedures (including critical care time)  Medications Ordered in UC Medications - No data to display   Initial Impression / Assessment and Plan / UC Course  I have reviewed the triage vital signs and the nursing notes.  Pertinent labs & imaging results that were available during my care of the patient were reviewed by me and considered in my medical decision making (see chart for details).    Discussed with patient history and exam most consistent with seasonal allergies vs viral URI. Symptomatic treatment as needed. Rx of prednisone sent to pharmacy, patient can fill and start if nasal spray does not resolve sinus pressure. Push fluids. Return precautions given.    Final Clinical Impressions(s) / UC Diagnoses   Final diagnoses:  Sinus pressure  Nasal congestion    ED Discharge Orders        Ordered    triamcinolone (NASACORT) 55 MCG/ACT AERO nasal inhaler  Daily     04/26/17 1623    benzonatate (TESSALON) 100 MG capsule  Every 8 hours     04/26/17  1623    predniSONE (DELTASONE) 20 MG tablet  Daily     04/26/17 1623         [address] Amy V, PA-C 04/26/17 1640

## 2017-05-03 DIAGNOSIS — M9902 Segmental and somatic dysfunction of thoracic region: Secondary | ICD-10-CM | POA: Diagnosis not present

## 2017-05-03 DIAGNOSIS — M9903 Segmental and somatic dysfunction of lumbar region: Secondary | ICD-10-CM | POA: Diagnosis not present

## 2017-05-03 DIAGNOSIS — M9901 Segmental and somatic dysfunction of cervical region: Secondary | ICD-10-CM | POA: Diagnosis not present

## 2017-10-30 ENCOUNTER — Other Ambulatory Visit: Payer: Self-pay

## 2017-10-30 ENCOUNTER — Encounter (HOSPITAL_BASED_OUTPATIENT_CLINIC_OR_DEPARTMENT_OTHER): Payer: Self-pay

## 2017-10-30 ENCOUNTER — Emergency Department (HOSPITAL_COMMUNITY): Admission: EM | Admit: 2017-10-30 | Payer: 59 | Source: Home / Self Care

## 2017-10-30 DIAGNOSIS — B084 Enteroviral vesicular stomatitis with exanthem: Secondary | ICD-10-CM | POA: Insufficient documentation

## 2017-10-30 DIAGNOSIS — Z79899 Other long term (current) drug therapy: Secondary | ICD-10-CM | POA: Insufficient documentation

## 2017-10-30 DIAGNOSIS — J029 Acute pharyngitis, unspecified: Secondary | ICD-10-CM | POA: Diagnosis present

## 2017-10-30 DIAGNOSIS — J020 Streptococcal pharyngitis: Secondary | ICD-10-CM | POA: Insufficient documentation

## 2017-10-30 NOTE — ED Triage Notes (Signed)
C/o rash to hands and feet this am and sore throat yesterday-was seen at [facility]-dx with strep-rx amoxil-states she is here because rash is worse-NAD-steady gait

## 2017-10-31 ENCOUNTER — Emergency Department (HOSPITAL_BASED_OUTPATIENT_CLINIC_OR_DEPARTMENT_OTHER)
Admission: EM | Admit: 2017-10-31 | Discharge: 2017-10-31 | Disposition: A | Discharge status: Home / Self Care | Payer: 59 | Attending: Emergency Medicine | Admitting: Emergency Medicine

## 2017-10-31 DIAGNOSIS — J020 Streptococcal pharyngitis: Secondary | ICD-10-CM

## 2017-10-31 DIAGNOSIS — B084 Enteroviral vesicular stomatitis with exanthem: Secondary | ICD-10-CM

## 2017-10-31 NOTE — ED Provider Notes (Signed)
[facility] Provider Note: Megan Dell, MD, FACEP  CSN: [id number] MRN: [id number] ARRIVAL: 10/30/17 at 2217 ROOM: [facility]10/[facility]   CHIEF COMPLAINT  Rash   HISTORY OF PRESENT ILLNESS  10/31/17 1:05 AM Megan Hanna is a 37 y.o. female with a 2-day history of a sore throat.  She was seen at an urgent care yesterday and diagnosed with strep by PCR.  She was placed on amoxicillin.  Yesterday, before taking her first dose of amoxicillin, she noticed a rash on her hands and feet.  The rash is maculopapular and is associated with tingling and some mild edema.  Symptoms are mild to moderate.  She has had a fever of about 100.  Her sore throat is moderate.  She also has a few bumps on her face as well.   Past Medical History:  Diagnosis Date  . Generalized abdominal pain 11/07/2016  . GERD (gastroesophageal reflux disease)   . Hematemesis with nausea 11/07/2016  . Hyperlipidemia 11/07/2016  . Insomnia 11/07/2016  . Obesity 11/07/2016  . Palpitation 11/06/2016    Past Surgical History:  Procedure Laterality Date  . CYST REMOVAL HAND Left     Family History  Problem Relation Age of Onset  . Hypertension Mother   . Diabetes Father   . Hypertension Father   . Breast cancer Maternal Grandmother        remission  . Heart disease Maternal Grandfather   . Heart attack Maternal Grandfather   . Heart disease Paternal Grandfather     Social History   Tobacco Use  . Smoking status: Never Smoker  . Smokeless tobacco: Never Used  Substance Use Topics  . Alcohol use: Yes    Comment: occ  . Drug use: Never    Prior to Admission medications   Medication Sig Start Date End Date Taking? Authorizing Provider  benzonatate (TESSALON) 100 MG capsule Take 1 capsule (100 mg total) by mouth every 8 (eight) hours. 04/26/17   Cathie Hoops, Amy V, PA-C  fexofenadine-pseudoephedrine (ALLEGRA-D 24) 180-240 MG 24 hr tablet Take 1 tablet by mouth daily.    [provider]  guaiFENesin  (MUCINEX) 600 MG 12 hr tablet Take by mouth 2 (two) times daily.    [provider]  triamcinolone (NASACORT) 55 MCG/ACT AERO nasal inhaler Place 2 sprays into the nose daily. 04/26/17   Belinda Fisher, PA-C    Allergies Patient has no known allergies.   REVIEW OF SYSTEMS  Negative except as noted here or in the History of Present Illness.   PHYSICAL EXAMINATION  Initial Vital Signs Blood pressure (!) 132/104, pulse 85, temperature 99 F (37.2 C), temperature source Oral, resp. rate 18, height 5\' 8"  (1.727 m), weight 115.7 kg, last menstrual period 10/25/2017, SpO2 100 %.  Examination General: Well-developed, well-nourished female in no acute distress; appearance consistent with age of record HENT: normocephalic; atraumatic; no intraoral lesions; mild pharyngeal erythema without exudate Eyes: pupils equal, round and reactive to light; extraocular muscles intact Neck: supple; mild anterior cervical lymphadenopathy Heart: regular rate and rhythm Lungs: clear to auscultation bilaterally Abdomen: soft; nondistended; nontender; bowel sounds present Extremities: No deformity; full range of motion Neurologic: Awake, alert and oriented; motor function intact in all extremities and symmetric; no facial droop Skin: Warm and dry; maculopapular rash of hands and feet with relative sparing of the palms and soles, few lesions of the perioral region:      Psychiatric: Normal mood and affect   RESULTS  Summary of this  visit's results, reviewed by myself:   EKG Interpretation  Date/Time:    Ventricular Rate:    PR Interval:    QRS Duration:   QT Interval:    QTC Calculation:   R Axis:     Text Interpretation:        Laboratory Studies: No results found for this or any previous visit (from the past 24 hour(s)). Imaging Studies: No results found.  ED COURSE and MDM  Nursing notes and initial vitals signs, including pulse oximetry, reviewed.  Vitals:   10/30/17 2230  10/30/17 2231  BP:  (!) 132/104  Pulse:  85  Resp:  18  Temp:  99 F (37.2 C)  TempSrc:  Oral  SpO2:  100%  Weight: 115.7 kg   Height: 5\' 8"  (1.727 m)    Rash is unlikely to be due to amoxicillin as it preceded her initiation of amoxicillin.  It is most consistent with hand, foot and mouth disease although it is atypical to spare the palms and soles.  She was advised to take over-the-counter NSAIDs for discomfort and to continue the amoxicillin.  PROCEDURES    ED DIAGNOSES     ICD-10-CM   1. Hand, foot and mouth disease B08.4   2. Strep pharyngitis J02.0        Dorthey Depace, MD 10/31/17 [phone]

## 2017-10-31 NOTE — ED Notes (Signed)
Patient is A&Ox4.  No signs of distress noted.  Please see providers complete history and physical exam.  

## 2017-10-31 NOTE — ED Notes (Signed)
PT states understanding of care given, follow up care. PT ambulated from ED to car with a steady gait.  

## 2018-07-22 IMAGING — MR MR HEAD WO/W CM
12 series · 48 of 48 positions shown · IV contrast (multihance)
Comparison: Brain MRI 02/24/2007

CLINICAL DATA: Headaches

EXAM:
MRI HEAD WITHOUT AND WITH CONTRAST
TECHNIQUE: Multiplanar, multiecho pulse sequences of the brain and surrounding
structures were obtained without and with intravenous contrast.
CONTRAST:  20mL MULTIHANCE GADOBENATE DIMEGLUMINE 529 MG/ML IV SOLN

[Series 2: t1_se_sag · sagittal · 5.0mm · 0.45mm/px · 1 of 21 slices shown]
[im 1/21]
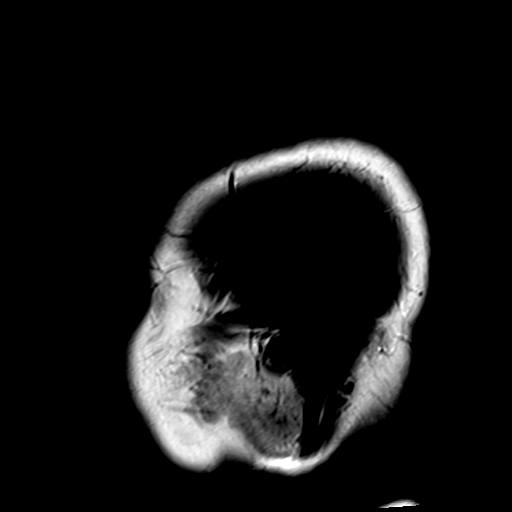

[Series 3: ep2d_diff_(id)_trace · axial · 3.0mm · 1.80mm/px · z∈[-54,+87]mm · 6 of 95 slices shown]
[im 1/95]
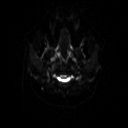
[im 19/95]
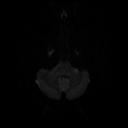
[im 38/95]
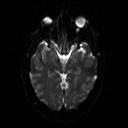
[im 57/95]
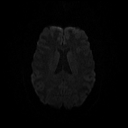
[im 76/95]
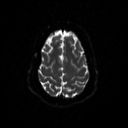
[im 95/95]
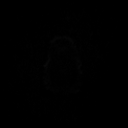

[Series 4: ep2d_diff_(id)_trace_adc · axial · 3.0mm · 1.80mm/px · z∈[-54,+87]mm · 4 of 48 slices shown]
[im 1/48]
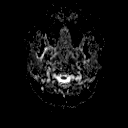
[im 16/48]
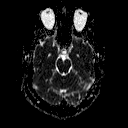
[im 32/48]
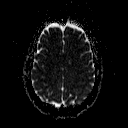
[im 48/48]
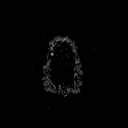

[Series 5: ep2d_diff_cor · coronal · 5.0mm · 1.77mm/px · 3 of 46 slices shown]
[im 1/46]
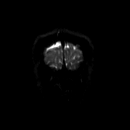
[im 23/46]
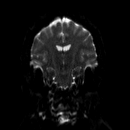
[im 46/46]
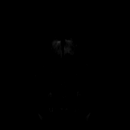

[Series 6: ep2d_diff_cor_adc · coronal · 5.0mm · 1.77mm/px · 2 of 24 slices shown]
[im 1/24]
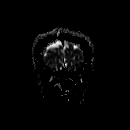
[im 24/24]
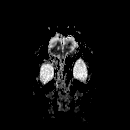

[Series 8: swi_images · axial · 5.0mm · 0.90mm/px · z∈[-56,+89]mm · 2 of 30 slices shown]
[im 1/30]
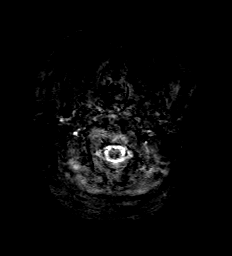
[im 30/30]
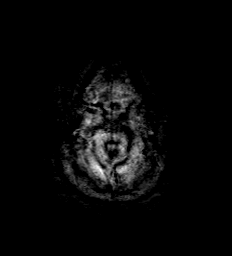

[Series 9: FLAIR · axial · 3.0mm · 0.43mm/px · z∈[-52,+86]mm · 2 of 24 slices shown]
[im 1/24]
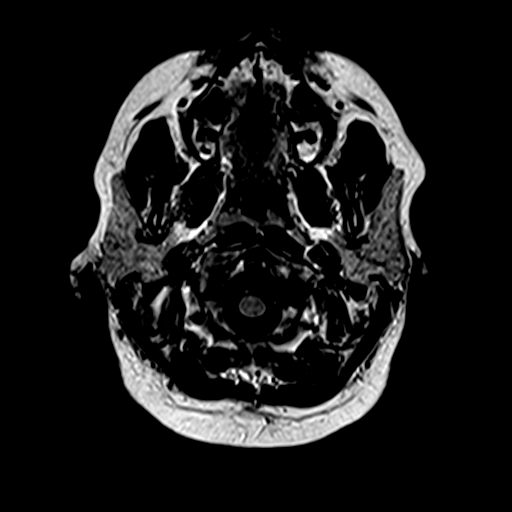
[im 24/24]
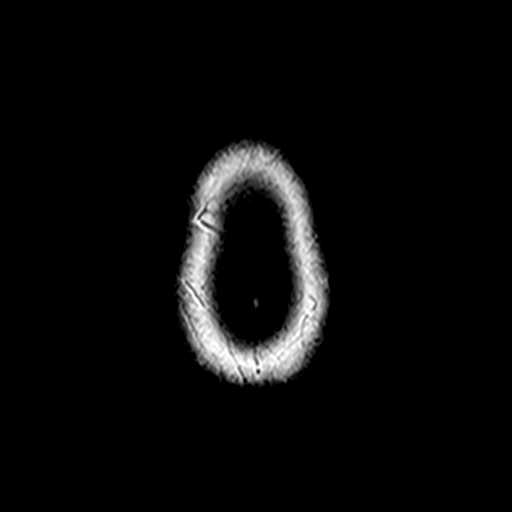

[Series 10: t2_tse_tra_512 · axial · 5.0mm · 0.60mm/px · z∈[-60,+94]mm · 2 of 23 slices shown]
[im 1/23]
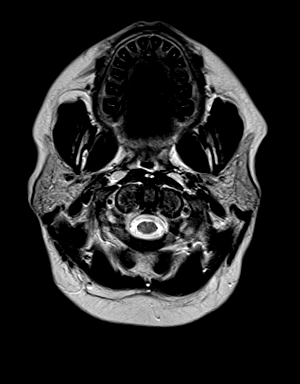
[im 23/23]
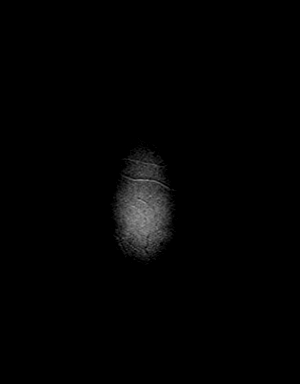

[Series 11: t1_mpr_tra · axial · 1.0mm · 0.72mm/px · z∈[-55,+88]mm · 11 of 144 slices shown]
[im 1/144]
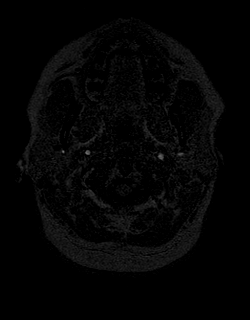
[im 15/144]
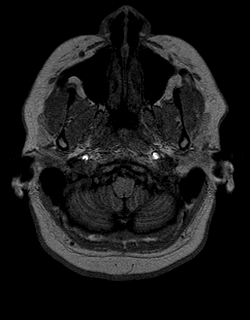
[im 29/144]
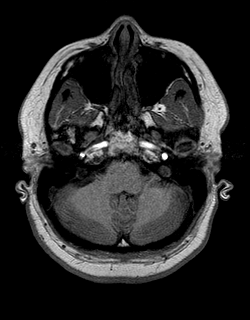
[im 43/144]
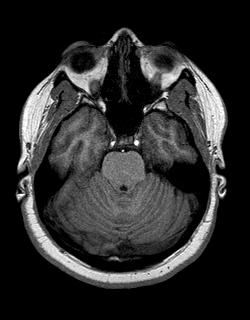
[im 58/144]
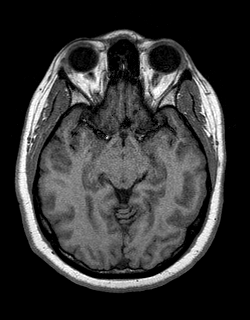
[im 72/144]
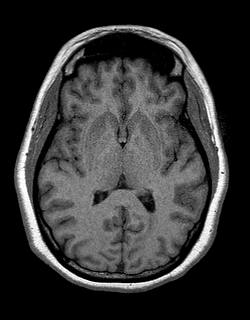
[im 86/144]
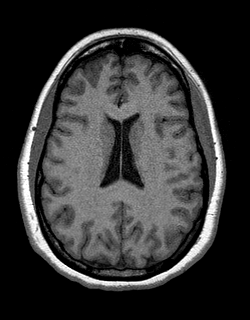
[im 101/144]
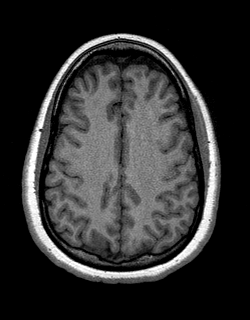
[im 115/144]
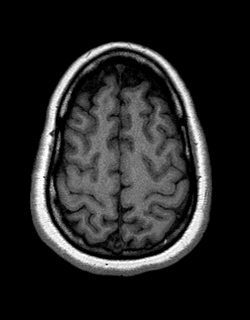
[im 129/144]
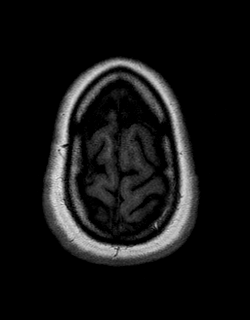
[im 144/144]
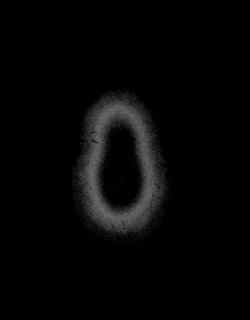

[Series 12: T2 · coronal · 5.0mm · 0.45mm/px · 2 of 25 slices shown]
[im 1/25]
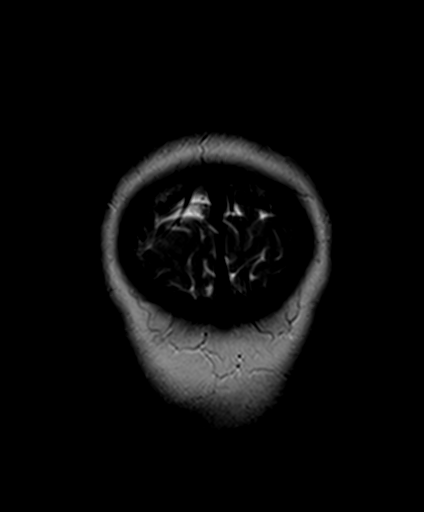
[im 25/25]
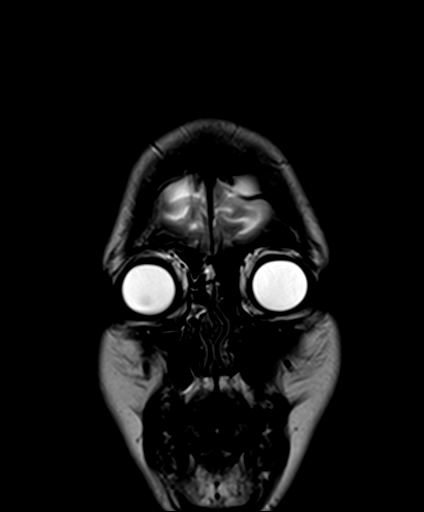

[Series 13: T1 post-contrast · coronal · 5.0mm · 0.72mm/px · 2 of 25 slices shown]
[im 1/25]
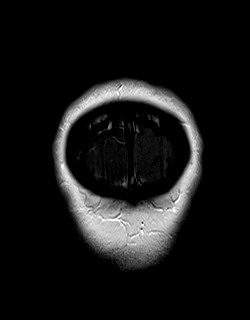
[im 25/25]
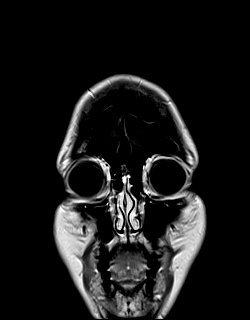

[Series 14: post t1_mpr_tra · axial · 1.0mm · 0.72mm/px · z∈[-55,+88]mm · 11 of 144 slices shown]
[im 1/144]
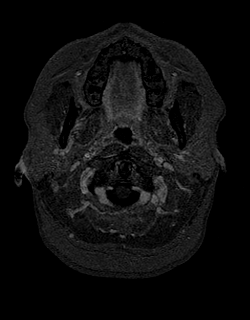
[im 15/144]
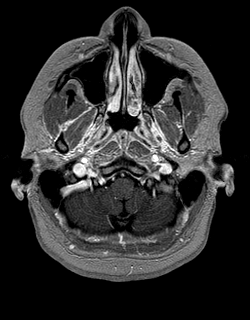
[im 29/144]
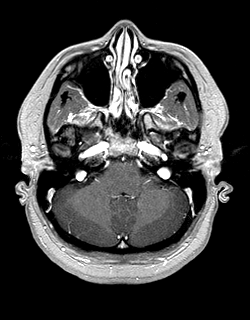
[im 43/144]
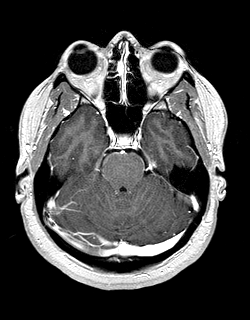
[im 58/144]
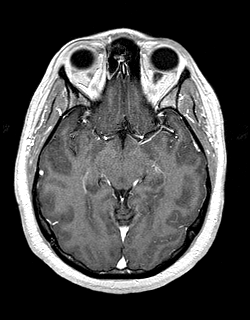
[im 72/144]
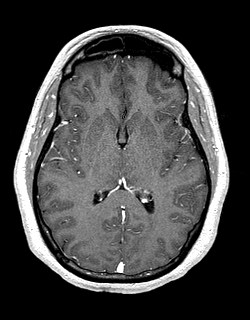
[im 86/144]
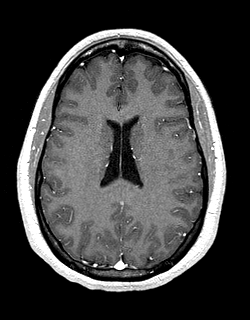
[im 101/144]
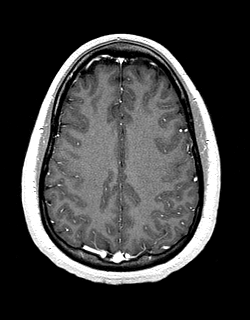
[im 115/144]
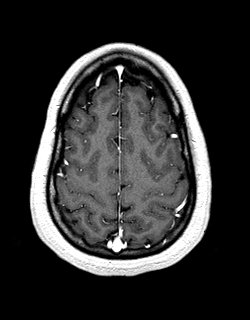
[im 129/144]
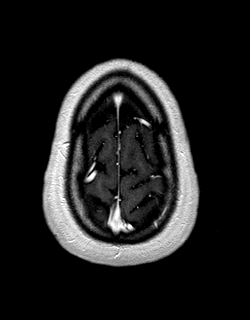
[im 144/144]
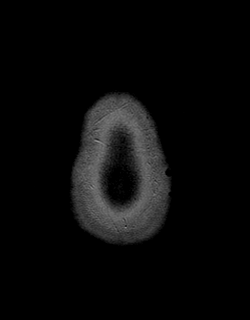

[48 of 48 positions shown; findings below may reference images not displayed]

FINDINGS: Brain: No focal diffusion restriction to indicate acute infarct. No
intraparenchymal hemorrhage. There is mild hyperintense T2-weighted
signal within the periventricular white matter, which may be seen in
the setting of migraine headaches or early chronic microvascular
disease; however, it is also seen in normal patients of this age. No
mass lesion or midline shift. No hydrocephalus or extra-axial fluid
collection. The midline structures are normal. No age advanced or
lobar predominant atrophy.

Vascular: Major intracranial arterial and venous sinus flow voids
are preserved. No evidence of chronic microhemorrhage or amyloid
angiopathy.

Skull and upper cervical spine: The visualized skull base,
calvarium, upper cervical spine and extracranial soft tissues are
normal.

Sinuses/Orbits: No fluid levels or advanced mucosal thickening. No
mastoid effusion. Normal orbits.
IMPRESSION: Normal MRI of the brain for age.

## 2019-01-28 ENCOUNTER — Encounter (INDEPENDENT_AMBULATORY_CARE_PROVIDER_SITE_OTHER): Payer: Self-pay | Admitting: Family Medicine

## 2019-01-28 ENCOUNTER — Other Ambulatory Visit: Payer: Self-pay

## 2019-01-28 ENCOUNTER — Ambulatory Visit (INDEPENDENT_AMBULATORY_CARE_PROVIDER_SITE_OTHER): Payer: 59 | Admitting: Family Medicine

## 2019-01-28 VITALS — BP 126/82 | HR 84 | Temp 97.9°F | Ht 67.0 in | Wt 248.0 lb

## 2019-01-28 DIAGNOSIS — F3289 Other specified depressive episodes: Secondary | ICD-10-CM

## 2019-01-28 DIAGNOSIS — D508 Other iron deficiency anemias: Secondary | ICD-10-CM

## 2019-01-28 DIAGNOSIS — R0602 Shortness of breath: Secondary | ICD-10-CM

## 2019-01-28 DIAGNOSIS — Z6838 Body mass index (BMI) 38.0-38.9, adult: Secondary | ICD-10-CM

## 2019-01-28 DIAGNOSIS — K219 Gastro-esophageal reflux disease without esophagitis: Secondary | ICD-10-CM | POA: Diagnosis not present

## 2019-01-28 DIAGNOSIS — Z0289 Encounter for other administrative examinations: Secondary | ICD-10-CM

## 2019-01-28 DIAGNOSIS — Z9189 Other specified personal risk factors, not elsewhere classified: Secondary | ICD-10-CM | POA: Diagnosis not present

## 2019-01-28 DIAGNOSIS — R5383 Other fatigue: Secondary | ICD-10-CM | POA: Diagnosis not present

## 2019-01-28 DIAGNOSIS — Z87898 Personal history of other specified conditions: Secondary | ICD-10-CM

## 2019-01-28 DIAGNOSIS — G4719 Other hypersomnia: Secondary | ICD-10-CM

## 2019-01-28 DIAGNOSIS — E038 Other specified hypothyroidism: Secondary | ICD-10-CM

## 2019-01-28 DIAGNOSIS — E039 Hypothyroidism, unspecified: Secondary | ICD-10-CM

## 2019-01-28 NOTE — Progress Notes (Signed)
Office: [phone]  /  Fax: [phone]    Date: February 03, 2019   Appointment Start Time: 9:06am Duration: 33 minutes Provider: Glennie Isle, Psy.D. Type of Session: Intake for Individual Therapy  Location of Patient: Home Location of Provider: Provider's Home Type of Contact: Telepsychological Visit via [employer] Web[employer]  Informed Consent: This provider called [facility] at 9:02am as she did not present for the WebEx appointment. Assistance on how to connect was provided and the e-mail with the secure link was re-sent. As such, today's appointment was initiated 6 minutes late. Prior to proceeding with today's appointment, two pieces of identifying information were obtained. In addition, Jaleiyah's physical location at the time of this appointment was obtained as well a phone number she could be reached at in the event of technical difficulties. Megan Hanna and this provider participated in today's telepsychological service.   The provider's role was explained to [employer]. The provider reviewed and discussed issues of confidentiality, privacy, and limits therein (e.g., reporting obligations). In addition to verbal informed consent, written informed consent for psychological services was obtained prior to the initial appointment. Since the clinic is not a 24/7 crisis center, mental health emergency resources were shared and this  provider explained MyChart, e-mail, voicemail, and/or other messaging systems should be utilized only for non-emergency reasons. This provider also explained that information obtained during appointments will be placed in Megan Hanna's medical record and relevant information will be shared with other providers at [facility] for coordination of care. Moreover, Toby agreed information may be shared with other [facility] providers as needed for coordination of care. By signing the service agreement document, Megan Hanna provided written consent for  coordination of care. Prior to initiating telepsychological services, Megan Hanna completed an informed consent document, which included the development of a safety plan (i.e., an emergency contact, nearest emergency room, and emergency resources) in the event of an emergency/crisis. Megan Hanna expressed understanding of the rationale of the safety plan. Megan Hanna verbally acknowledged understanding she is ultimately responsible for understanding her insurance benefits for telepsychological and in-person services. This provider also reviewed confidentiality, as it relates to telepsychological services, as well as the rationale for telepsychological services (i.e., to reduce exposure risk to COVID-19). Megan Hanna  acknowledged understanding that appointments cannot be recorded without both party consent and she is aware she is responsible for securing confidentiality on her end of the session. Megan Hanna verbally consented to proceed.  Chief Complaint/HPI: Megan Hanna was referred by Dr. Briscoe Deutscher due to depression with emotional eating behaviors. Per the note for the initial visit with Dr. Briscoe Deutscher on January 28, 2019, "Grabiela is struggling with emotional eating and using food for comfort to the extent that it is negatively impacting her health. She has been working on behavior modification techniques to help reduce her emotional eating and has been unsuccessful. She shows no sign of suicidal or homicidal ideations. [name] uses food when stressed or as a reward." During the initial appointment, Megan Hanna reported experiencing the following: snacking frequently in the evenings, frequently drinking liquids with calories, struggling with emotional eating, craving chocolate and pasta, frequently skipping breakfast and/or lunch, trying to follow a vegeterian diet and thinking she sometimes makes poor food choices. Oval's Food and Mood (modified PHQ-9) score on January 28, 2019 was 12.  During today's appointment, Megan Hanna  reported experiencing emotional eating when menstruating and bored. She also reported an exacerbation in emotional eating when her grandmother passed away in Feb 22, 2017. She was verbally administered a questionnaire  assessing various behaviors related to emotional eating. Megan Hanna endorsed the following: overeat when you are celebrating, eat certain foods when you are anxious, stressed, depressed, or your feelings are hurt, use food to help you cope with emotional situations, find food is comforting to you and eat as a reward. Megan Hanna believes the onset of emotional eating was likely in adulthood, and described the current frequency of emotional eating as "if once a month." In addition, Megan Hanna denied a history of binge eating. Megan Hanna denied a history of restricting food intake, purging and engagement in other compensatory strategies, and has never been diagnosed with an eating disorder. She also denied a history of treatment for emotional eating. Moreover, Megan Hanna indicated boxing, doing home projects, painting, and not having certain foods in the house makes emotional eating better. Furthermore, Megan Hanna shared she does "not sleep much," noting she has "insomnia." She indicated she is prescribed a sleeping aide by her PCP, adding she has not taken it in almost a year.   Mental Status Examination:  Appearance: well groomed and appropriate hygiene  Behavior: appropriate to circumstances Mood: euthymic Affect: mood congruent Speech: normal in rate, volume, and tone Eye Contact: appropriate Psychomotor Activity: appropriate Gait: unable to assess Thought Process: linear, logical, and goal directed  Thought Content/Perception: denies suicidal and homicidal ideation, plan, and intent and no hallucinations, delusions, bizarre thinking or behavior reported or observed Orientation: time, person, place and purpose of appointment Memory/Concentration: memory, attention, language, and fund of knowledge intact    Insight/Judgment: good  Family & Psychosocial History: Megan Hanna reported she is engaged and she has a daughter (age 36). 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 Additionally, Dhani shared her highest level of education obtained is a bachelor's degree. Currently, Brandon's social support system consists of mother, father, brother, sister, few close friends, and fiance. Moreover, Abigial stated she resides with her daughter and fiance.   Medical History:  Past Medical History:  Diagnosis Date   Anemia    Back pain    Generalized abdominal pain 11/07/2016   GERD (gastroesophageal reflux disease)    Hematemesis with nausea 11/07/2016   Hyperlipidemia 11/07/2016   Insomnia 11/07/2016   Joint pain    Obesity 11/07/2016   Palpitation 11/06/2016   Prediabetes    Past Surgical History:  Procedure Laterality Date   CYST REMOVAL HAND Left    No current outpatient medications on file prior to visit.   No current facility-administered medications on file prior to visit.  Suhayla denied a history of head injuries and loss of consciousness.    Mental Health History: Sharika stated she attended pre-marital counseling in 2018 and attended marriage counseling during her first marriage in 2009. Hisayo reported there is no history of hospitalizations for psychiatric concerns, and has never met with a psychiatrist. Willella recalled she was previously prescribed Lexapro in 2007 by her PCP. Monnie denied a family history of mental health related concerns. Josselyne reported there is no history of trauma including psychological, physical  and sexual abuse, as well as neglect.   Tamaka described her typical mood lately as "easy going, happy." Aside from concerns noted above and endorsed on the PHQ-9, Koreena reported increased pain, decreased energy, and decreased self-image due to weight. Licet endorsed current alcohol use, adding she would 2-3  standard pours of wine over the course of the weekend prior to starting with the clinic; however, she clarified it was not every weekend. She denied tobacco use. She  denied illicit/recreational substance use. Regarding caffeine intake, Kymberlyn reported consuming 16 oz. of sweet tea approximately three times a week prior to starting with the clinic. Furthermore, Megan Hanna indicated she is not experiencing the following: hopelessness, hallucinations and delusions, paranoia, symptoms of mania (e.g., expansive mood, flighty ideas, decreased need for sleep, engagement in risky behaviors), social withdrawal, crying spells, panic attacks and decreased motivation. She also denied history of and current suicidal ideation, plan, and intent; history of and current homicidal ideation, plan, and intent; and history of and current engagement in self-harm.  The following strengths were reported by [place]: patient, caring, extremely organized, and creative. The following strengths were observed by this provider: ability to express thoughts and feelings during the therapeutic session, ability to establish and benefit from a therapeutic relationship, willingness to work toward established goal(s) with the clinic and ability to engage in reciprocal conversation.  Legal History: Laaibah reported there is no history of legal involvement.   Structured Assessments Results: The Patient Health Questionnaire-9 (PHQ-9) is a self-report measure that assesses symptoms and severity of depression over the course of the last two weeks. Grayson obtained a score of 2 suggesting minimal depression. Navneet finds the endorsed symptoms to be somewhat difficult. [0= Not at all; 1= Several days; 2= More than half the days; 3= Nearly every day] Little interest or pleasure in doing things 0  Feeling down, depressed, or hopeless 0  Trouble falling or staying asleep, or sleeping too much 2  Feeling tired or having little energy 0  Poor appetite or  overeating 0  Feeling bad about yourself --- or that you are a failure or have let yourself or your family down 0  Trouble concentrating on things, such as reading the newspaper or watching television 0  Moving or speaking so slowly that other people could have noticed? Or the opposite --- being so fidgety or restless that you have been moving around a lot more than usual 0  Thoughts that you would be better off dead or hurting yourself in some way 0  PHQ-9 Score 2    The Generalized Anxiety Disorder-7 (GAD-7) is a brief self-report measure that assesses symptoms of anxiety over the course of the last two weeks. Charlisa obtained a score of 0. [0= Not at all; 1= Several days; 2= Over half the days; 3= Nearly every day] Feeling nervous, anxious, on edge 0  Not being able to stop or control worrying 0  Worrying too much about different things 0  Trouble relaxing 0  Being so restless that it's hard to sit still 0  Becoming easily annoyed or irritable 0  Feeling afraid as if something awful might happen 0  GAD-7 Score 0   Interventions:  Conducted a chart review Focused on rapport building Verbally administered PHQ-9 and GAD-7 for symptom monitoring Verbally administered Food & Mood questionnaire to assess various behaviors related to emotional eating. Provided emphatic reflections and validation Collaborated with patient on a treatment goal  Psychoeducation provided regarding physical versus emotional hunger  Provisional DSM-5 Diagnosis: 311 (F32.8) Other Specified Depressive Disorder, Emotional Eating Behaviors  Plan: Nalee appears able and willing to participate as evidenced by collaboration on a treatment goal, engagement in reciprocal conversation, and asking questions as needed for clarification. The next appointment will be scheduled in two weeks, which will be via [employer]. The following treatment goal was established: increase coping skills. This provider will regularly review  the treatment plan and medical chart to keep informed of status  changes. Chiamaka expressed understanding and agreement with the initial treatment plan of care. Zetha will be sent a handout via e-mail to utilize between now and the next appointment to increase awareness of hunger patterns and subsequent eating. Megan Hanna provided verbal consent during today's appointment for this provider to send the handout via e-mail.

## 2019-01-28 NOTE — Progress Notes (Signed)
Dear Dr. Hyman Hopes,   Thank you for referring Megan Hanna to our clinic. The following note includes my evaluation and treatment recommendations.  Chief Complaint:   OBESITY Megan Hanna (MR# [id number]) is a 39 y.o. female who presents for evaluation and treatment of obesity and related comorbidities. Current BMI is Body mass index is 38.84 kg/m. [name] has been struggling with her weight for many years and has been unsuccessful in either losing weight, maintaining weight loss, or reaching her healthy weight goal.  [name] is currently in the action stage of change and ready to dedicate time achieving and maintaining a healthier weight. [name] is interested in becoming our patient and working on intensive lifestyle modifications including (but not limited to) diet and exercise for weight loss.  [name]'s habits were reviewed today and are as follows: Her family eats meals together, she thinks her family will eat healthier with her, her desired weight loss is 69 pounds, she has been heavy most of her life, she started gaining weight at 39 years old, her heaviest weight ever was 257 pounds, she craves chocolate and pasta, she snacks frequently in the evenings, she skips breakfast and/or lunch frequently, she is trying to follow a vegetarian diet, she is frequently drinking liquids with calories, she thinks she sometimes makes poor food choices and she struggles with emotional eating.  Depression Screen [name]'s Food and Mood (modified PHQ-9) score was 12.  Depression screen [facility] 2/9 01/28/2019  Decreased Interest 2  Down, Depressed, Hopeless 1  PHQ - 2 Score 3  Altered sleeping 3  Tired, decreased energy 3  Change in appetite 2  Feeling bad or failure about yourself  0  Trouble concentrating 0  Moving slowly or fidgety/restless 1  Suicidal thoughts 0  PHQ-9 Score 12  Difficult doing work/chores Not difficult at all   Subjective:   1. Other fatigue [name] admits to  daytime somnolence and reports waking up still tired. Patent has a history of symptoms of daytime fatigue and morning fatigue. [name] generally gets 3-5 hours of sleep per night, and states that she has poor quality sleep. Snoring is present. Apneic episodes are not present. [name] is 8.  2. SOB (shortness of breath) on exertion Aeisha notes increasing shortness of breath with exercising and seems to be worsening over time with weight gain. She notes getting out of breath sooner with activity than she used to. This has gotten worse recently. [name] denies shortness of breath at rest or orthopnea.  3. Other iron deficiency anemia [name] is not a vegetarian, but would like to be.  She does not have a history of weight loss surgery.   CBC Latest Ref Rng & Units 05/09/2009 05/09/2009 05/07/2009  WBC 4.0 - 10.5 K/uL 18.3(H) 16.9(H) 12.5(H)  Hemoglobin 12.0 - 15.0 g/dL 7.3(L) 6.7 CRITICAL  12.0  Hematocrit 36.0 - 46.0 % 21.9(L) 19.7(L) 36.0  Platelets 150 - 400 K/uL 272 225 332   4. Gastroesophageal reflux disease without esophagitis She has been experiencing heartburn.   5. History of prediabetes and gestational diabetes  [name] has a diagnosis of prediabetes based on her elevated HgA1c and was informed this puts her at greater risk of developing diabetes. She continues to work on diet and exercise to decrease her risk of diabetes. She denies nausea or hypoglycemia.  6. Excessive daytime sleepiness [name] admits to still being tired when waking up in the morning.  She also reports that she snores.  [name] is  8.  History of negative sleep study.  7. Subclinical hypothyroidism [place] has history of elevated TSH.   8. Other depression, with emotional eating  Cortne is struggling with emotional eating and using food for comfort to the extent that it is negatively impacting her health. She has been working on behavior modification techniques to help reduce her  emotional eating and has been unsuccessful. She shows no sign of suicidal or homicidal ideations.  Joe uses food when stressed or as a reward.  9. At risk for diabetes mellitus [name] is at higher than average risk for developing diabetes due to her obesity.   Assessment/Plan:   1. Other fatigue Spirit does feel that her weight is causing her energy to be lower than it should be. Fatigue may be related to obesity, depression or many other causes. Labs will be ordered, and in the meanwhile, [name] will focus on self care including making healthy food choices, increasing physical activity and focusing on stress reduction.  Orders - EKG 12-Lead - Comprehensive metabolic panel - CBC with Differential/Platelet - Hemoglobin A1c - Insulin, random - Anemia panel - Lipid Panel With LDL/HDL Ratio - VITAMIN D 25 Hydroxy (Vit-D Deficiency, Fractures)  2. SOB (shortness of breath) on exertion [name] does feel that she gets out of breath more easily that she used to when she exercises. [name]'s shortness of breath appears to be obesity related and exercise induced. She has agreed to work on weight loss and gradually increase exercise to treat her exercise induced shortness of breath. Will continue to monitor closely.  Orders - Comprehensive metabolic panel - CBC with Differential/Platelet - Hemoglobin A1c - Insulin, random - Anemia panel - Lipid Panel With LDL/HDL Ratio - VITAMIN D 25 Hydroxy (Vit-D Deficiency, Fractures)  3. Other iron deficiency anemia Orders and follow up as documented in patient record.  Counseling . Iron is essential for our bodies to make red blood cells.  Reasons that someone may be deficient include: an iron-deficient diet (more likely in those following vegan or vegetarian diets), women with heavy menses, patients with GI disorders or poor absorption, patients that have had bariatric surgery, frequent blood donors, patients with cancer, and patients with heart  disease.   [name] include dark leafy greens, red and white meats, eggs, seafood, and beans.   . Certain foods and drinks prevent your body from absorbing iron properly. Avoid eating these foods in the same meal as iron-rich foods or with iron supplements. These foods include: coffee, black tea, and red wine; milk, dairy products, and foods that are high in calcium; beans and soybeans; whole grains.  . Constipation can be a side effect of iron supplementation. Increased water and fiber intake are helpful. Water goal: > 2 liters/day. Fiber goal: > 25 grams/day. - Anemia panel  4. Gastroesophageal reflux disease without esophagitis Intensive lifestyle modifications are the first line treatment for this issue. We discussed several lifestyle modifications today and she will continue to work on diet, exercise and weight loss efforts. Orders and follow up as documented in patient record.   Counseling . If a person has gastroesophageal reflux disease (GERD), food and stomach acid move back up into the esophagus and cause symptoms or problems such as damage to the esophagus. . Anti-reflux measures include: raising the head of the bed, avoiding tight clothing or belts, avoiding eating late at night, not lying down shortly after mealtime, and achieving weight loss. . Avoid ASA, NSAID's, caffeine, alcohol, and tobacco.  . OTC  Pepcid and/or Tums are often very helpful for as needed use.  Marland Kitchen However, for persisting chronic or daily symptoms, stronger medications like Omeprazole may be needed. . You may need to avoid foods and drinks such as: ? Coffee and tea (with or without caffeine). ? Drinks that contain alcohol. ? Energy drinks and sports drinks. ? Bubbly (carbonated) drinks or sodas. ? Chocolate and cocoa. ? Peppermint and mint flavorings. ? Garlic and onions. ? Horseradish. ? Spicy and acidic foods. These include peppers, chili powder, curry powder, vinegar, hot sauces, and BBQ  sauce. ? Citrus fruit juices and citrus fruits, such as oranges, lemons, and limes. ? Tomato-based foods. These include red sauce, chili, salsa, and pizza with red sauce. ? Fried and fatty foods. These include donuts, french fries, potato chips, and high-fat dressings. ? High-fat meats. These include hot dogs, rib eye steak, sausage, ham, and bacon.  5. History of prediabetes and gestational diabetes  Yarielis will continue to work on weight loss, exercise, and decreasing simple carbohydrates to help decrease the risk of diabetes.   6. Excessive daytime sleepiness Will monitor and obtain sleep study if weight loss stalls.   7. Subclinical hypothyroidism Patient with long-standing hypothyroidism, on levothyroxine therapy. She appears euthyroid. Orders and follow up as documented in patient record.  Counseling . Good thyroid control is important for overall health. Supratherapeutic thyroid levels are dangerous and will not improve weight loss results. . The correct way to take levothyroxine is fasting, with water, separated by at least 30 minutes from breakfast, and separated by more than 4 hours from calcium, iron, multivitamins, acid reflux medications (PPIs).   Orders - T4, free - T3 - TSH  8. Other depression, with emotional eating  Behavior modification techniques were discussed today to help Karis deal with her emotional/non-hunger eating behaviors.  Orders and follow up as documented in patient record.  Patient was referred to Dr. Dewaine Conger, our Bariatric Psychologist, for evaluation due to her elevated PHQ-9 score and significant struggles with emotional eating.  9. At risk for diabetes mellitus Siriyah was given approximately 15 minutes of diabetes education and counseling today. We discussed intensive lifestyle modifications today with an emphasis on weight loss as well as increasing exercise and decreasing simple carbohydrates in her diet. We also reviewed medication options with  an emphasis on risk versus benefit of those discussed.   10. Class 2 severe obesity with serious comorbidity and body mass index (BMI) of 38.0 to 38.9 in adult, unspecified obesity type (HCC) Cambre is currently in the action stage of change and her goal is to continue with weight loss efforts. I recommend Savina begin the structured treatment plan as follows:  She has agreed to the Vegetarian Plan + 100 calorie snack.  Exercise goals: No exercise has been prescribed at this time.   Behavioral modification strategies: increasing lean protein intake, decreasing simple carbohydrates, increasing vegetables, increasing water intake, decreasing liquid calories and meal planning and cooking strategies.  She was informed of the importance of frequent follow-up visits to maximize her success with intensive lifestyle modifications for her multiple health conditions. She was informed we would discuss her lab results at her next visit unless there is a critical issue that needs to be addressed sooner. Arden agreed to keep her next visit at the agreed upon time to discuss these results.  Objective:   Blood pressure 126/82, pulse 84, temperature 97.9 F (36.6 C), temperature source Oral, height 5\' 7"  (1.702 m), weight 248 lb (112.5 kg),  last menstrual period 01/23/2019, SpO2 100 %. Body mass index is 38.84 kg/m.  EKG: Normal sinus rhythm, rate 95 bpm.  Indirect Calorimeter completed today shows a VO2 of 240 and a REE of 1667.  Her calculated basal metabolic rate is 2706 thus her basal metabolic rate is worse than expected.  General: Cooperative, alert, well developed, in no acute distress. HEENT: Conjunctivae and lids unremarkable. Cardiovascular: Regular rhythm.  Lungs: Normal work of breathing. Neurologic: No focal deficits.   Lab Results  Component Value Date   CREATININE 1.03 05/09/2009   BUN 11 05/09/2009   NA 136 05/09/2009   K 4.3 05/09/2009   CL 107 05/09/2009   CO2 22 05/09/2009    Lab Results  Component Value Date   ALT 16 05/09/2009   AST 22 05/09/2009   ALKPHOS 91 05/09/2009   BILITOT 0.2 (L) 05/09/2009   Lab Results  Component Value Date   WBC 18.3 (H) 05/09/2009   HGB 7.3 (L) 05/09/2009   HCT 21.9 (L) 05/09/2009   MCV 84.0 05/09/2009   PLT 272 05/09/2009   Attestation Statements:   This is the patient's first visit at [facility]. The patient's NEW PATIENT PACKET was reviewed at length. Included in the packet: current and past health history, medications, allergies, ROS, gynecologic history (women only), surgical history, family history, social history, weight history, weight loss surgery history (for those that have had weight loss surgery), nutritional evaluation, mood and food questionnaire, PHQ9, Epworth questionnaire, sleep habits questionnaire, patient life and health improvement goals questionnaire. These will all be scanned into the patient's chart under media.   During the visit, I independently reviewed the patient's EKG, bioimpedance scale results, and indirect calorimeter results. I used this information to tailor a meal plan for the patient that will help her to lose weight and will improve her obesity-related conditions going forward. I performed a medically necessary appropriate examination and/or evaluation. I discussed the assessment and treatment plan with the patient. The patient was provided an opportunity to ask questions and all were answered. The patient agreed with the plan and demonstrated an understanding of the instructions. Labs were ordered at this visit and will be reviewed at the next visit unless more critical results need to be addressed immediately. I communicated with the referring physician. Clinical information was updated and documented in the EMR.   Time spent on visit including pre-visit chart review and post-visit care was 60 minutes.   A separate 15 minutes was spent on risk counseling (see above).   I,  Water quality scientist, CMA, am acting as Location manager for [employer], DO.  I have reviewed the above documentation for accuracy and completeness, and I agree with the above. Briscoe Deutscher, DO

## 2019-01-29 ENCOUNTER — Encounter (INDEPENDENT_AMBULATORY_CARE_PROVIDER_SITE_OTHER): Payer: Self-pay | Admitting: Family Medicine

## 2019-01-29 LAB — LIPID PANEL WITH LDL/HDL RATIO
Cholesterol, Total: 184 mg/dL (ref 100–199)
HDL: 46 mg/dL (ref >39)
LDL Chol Calc (NIH): 123 mg/dL — ABNORMAL HIGH (ref 0–99)
LDL/HDL Ratio: 2.7 ratio (ref 0.0–3.2)
Triglycerides: 82 mg/dL (ref 0–149)
VLDL Cholesterol Cal: 15 mg/dL (ref 5–40)

## 2019-01-29 LAB — CBC WITH DIFFERENTIAL/PLATELET
Basophils Absolute: 0.1 10*3/uL (ref 0.0–0.2)
Basos: 1 %
EOS (ABSOLUTE): 0 10*3/uL (ref 0.0–0.4)
Eos: 0 %
Hemoglobin: 13.7 g/dL (ref 11.1–15.9)
Immature Grans (Abs): 0 10*3/uL (ref 0.0–0.1)
Immature Granulocytes: 0 %
Lymphocytes Absolute: 2.5 10*3/uL (ref 0.7–3.1)
Lymphs: 50 %
MCH: 28 pg (ref 26.6–33.0)
MCHC: 32.2 g/dL (ref 31.5–35.7)
MCV: 87 fL (ref 79–97)
Monocytes Absolute: 0.4 10*3/uL (ref 0.1–0.9)
Monocytes: 7 %
Neutrophils Absolute: 2.1 10*3/uL (ref 1.4–7.0)
Neutrophils: 42 %
Platelets: 418 10*3/uL (ref 150–450)
RBC: 4.89 x10E6/uL (ref 3.77–5.28)
RDW: 14.6 % (ref 11.7–15.4)
WBC: 5 10*3/uL (ref 3.4–10.8)

## 2019-01-29 LAB — COMPREHENSIVE METABOLIC PANEL
ALT: 16 IU/L (ref 0–32)
AST: 17 IU/L (ref 0–40)
Albumin/Globulin Ratio: 1 — ABNORMAL LOW (ref 1.2–2.2)
Albumin: 4 g/dL (ref 3.8–4.8)
Alkaline Phosphatase: 83 IU/L (ref 39–117)
BUN/Creatinine Ratio: 15 (ref 9–23)
BUN: 13 mg/dL (ref 6–24)
Bilirubin Total: 0.3 mg/dL (ref 0.0–1.2)
CO2: 21 mmol/L (ref 20–29)
Calcium: 9.2 mg/dL (ref 8.7–10.2)
Chloride: 102 mmol/L (ref 96–106)
Creatinine, Ser: 0.85 mg/dL (ref 0.57–1.00)
GFR calc Af Amer: 99 mL/min/{1.73_m2} (ref >59)
GFR calc non Af Amer: 86 mL/min/{1.73_m2} (ref >59)
Globulin, Total: 4 g/dL (ref 1.5–4.5)
Glucose: 86 mg/dL (ref 65–99)
Potassium: 4.7 mmol/L (ref 3.5–5.2)
Sodium: 139 mmol/L (ref 134–144)
Total Protein: 8 g/dL (ref 6.0–8.5)

## 2019-01-29 LAB — ANEMIA PANEL
Ferritin: 136 ng/mL (ref 15–150)
Folate, Hemolysate: 348 ng/mL
Folate, RBC: 817 ng/mL (ref >498)
Hematocrit: 42.6 % (ref 34.0–46.6)
Iron Saturation: 18 % (ref 15–55)
Iron: 51 ug/dL (ref 27–159)
Retic Ct Pct: 0.9 % (ref 0.6–2.6)
Total Iron Binding Capacity: 283 ug/dL (ref 250–450)
UIBC: 232 ug/dL (ref 131–425)
Vitamin B-12: 491 pg/mL (ref 232–1245)

## 2019-01-29 LAB — VITAMIN D 25 HYDROXY (VIT D DEFICIENCY, FRACTURES): Vit D, 25-Hydroxy: 11.7 ng/mL — ABNORMAL LOW (ref 30.0–100.0)

## 2019-01-29 LAB — T4, FREE: Free T4: 1.08 ng/dL (ref 0.82–1.77)

## 2019-01-29 LAB — HEMOGLOBIN A1C
Est. average glucose Bld gHb Est-mCnc: 123 mg/dL
Hgb A1c MFr Bld: 5.9 % — ABNORMAL HIGH (ref 4.8–5.6)

## 2019-01-29 LAB — TSH: TSH: 2.55 u[IU]/mL (ref 0.450–4.500)

## 2019-01-29 LAB — T3: T3, Total: 112 ng/dL (ref 71–180)

## 2019-01-29 LAB — INSULIN, RANDOM: INSULIN: 22.7 u[IU]/mL (ref 2.6–24.9)

## 2019-02-03 ENCOUNTER — Ambulatory Visit (INDEPENDENT_AMBULATORY_CARE_PROVIDER_SITE_OTHER): Payer: 59 | Admitting: Psychology

## 2019-02-03 ENCOUNTER — Other Ambulatory Visit: Payer: Self-pay

## 2019-02-03 DIAGNOSIS — F3289 Other specified depressive episodes: Secondary | ICD-10-CM | POA: Diagnosis not present

## 2019-02-04 NOTE — Progress Notes (Unsigned)
Office: [phone]  /  Fax: [phone]    Date: February 18, 2019   Appointment Start Time: *** Duration: *** minutes Provider: Lawerance Cruel, Psy.D. Type of Session: Individual Therapy  Location of Patient: {gbptloc:23249} Location of Provider: Provider's Home Type of Contact: Telepsychological Visit via {gbtelepsych:23399}  Session Content: This provider called Mackenzy at 2:33pm as she did not present for the WebEx appointment. A HIPAA compliant voicemail requesting a call back was left. The e-mail with the secure link was re-sent. As such, today's appointment was initiated *** minutes late.  Taija is a 39 y.o. female presenting via {gbtelepsych:23399} for a follow-up appointment to address the previously established treatment goal of increasing coping skills. Today's appointment was a telepsychological visit due to COVID-19. Sammantha provided verbal consent for today's telepsychological appointment and she is aware she is responsible for securing confidentiality on her end of the session. Prior to proceeding with today's appointment, Cindia's physical location at the time of this appointment was obtained as well a phone number she could be reached at in the event of technical difficulties. Colin Mulders and this provider participated in today's telepsychological service.   This provider conducted a brief check-in and verbally administered the PHQ-9 and GAD-7. *** Carlinda was receptive to today's appointment as evidenced by openness to sharing, responsiveness to feedback, and {gbreceptiveness:23401}.  Mental Status Examination:  Appearance: {Appearance:22431} Behavior: {Behavior:22445} Mood: {gbmood:21757} Affect: {Affect:22436} Speech: {Speech:22432} Eye Contact: {Eye Contact:22433} Psychomotor Activity: {Motor Activity:22434} Gait: {gbgait:23404} Thought Process: {thought process:22448}  Thought Content/Perception: {disturbances:22451} Orientation:  {Orientation:22437} Memory/Concentration: {gbcognition:22449} Insight/Judgment: {Insight:22446}  Structured Assessments Results: The Patient Health Questionnaire-9 (PHQ-9) is a self-report measure that assesses symptoms and severity of depression over the course of the last two weeks. Laqueena obtained a score of *** suggesting {GBPHQ9SEVERITY:21752}. Caoilainn finds the endorsed symptoms to be {gbphq9difficulty:21754}. [0= Not at all; 1= Several days; 2= More than half the days; 3= Nearly every day] Little interest or pleasure in doing things ***  Feeling down, depressed, or hopeless ***  Trouble falling or staying asleep, or sleeping too much ***  Feeling tired or having little energy ***  Poor appetite or overeating ***  Feeling bad about yourself --- or that you are a failure or have let yourself or your family down ***  Trouble concentrating on things, such as reading the newspaper or watching television ***  Moving or speaking so slowly that other people could have noticed? Or the opposite --- being so fidgety or restless that you have been moving around a lot more than usual ***  Thoughts that you would be better off dead or hurting yourself in some way ***  PHQ-9 Score ***    The Generalized Anxiety Disorder-7 (GAD-7) is a brief self-report measure that assesses symptoms of anxiety over the course of the last two weeks. Brihanna obtained a score of *** suggesting {gbgad7severity:21753}. Madalene finds the endorsed symptoms to be {gbphq9difficulty:21754}. [0= Not at all; 1= Several days; 2= Over half the days; 3= Nearly every day] Feeling nervous, anxious, on edge ***  Not being able to stop or control worrying ***  Worrying too much about different things ***  Trouble relaxing ***  Being so restless that it's hard to sit still ***  Becoming easily annoyed or irritable ***  Feeling afraid as if something awful might happen ***  GAD-7 Score ***   Interventions:  {Interventions for  Progress Notes:23405}  DSM-5 Diagnosis: 311 (F32.8) Other Specified Depressive Disorder, Emotional Eating Behaviors  Treatment Goal & Progress:  During the initial appointment with this provider, the following treatment goal was established: increase coping skills. Manika has demonstrated progress in her goal as evidenced by {gbtxprogress:22839}. Haeleigh also {gbtxprogress2:22951}.  Plan: The next appointment will be scheduled in {gbweeks:21758}, which will be {gbtxmodality:23402}. The next session will focus on {Plan for Next Appointment:23400}.

## 2019-02-11 ENCOUNTER — Other Ambulatory Visit: Payer: Self-pay

## 2019-02-11 ENCOUNTER — Encounter (INDEPENDENT_AMBULATORY_CARE_PROVIDER_SITE_OTHER): Payer: Self-pay | Admitting: Family Medicine

## 2019-02-11 ENCOUNTER — Ambulatory Visit (INDEPENDENT_AMBULATORY_CARE_PROVIDER_SITE_OTHER): Payer: 59 | Admitting: Family Medicine

## 2019-02-11 VITALS — BP 118/74 | HR 78 | Temp 98.2°F | Ht 67.0 in | Wt 242.0 lb

## 2019-02-11 DIAGNOSIS — E7800 Pure hypercholesterolemia, unspecified: Secondary | ICD-10-CM | POA: Diagnosis not present

## 2019-02-11 DIAGNOSIS — R1013 Epigastric pain: Secondary | ICD-10-CM | POA: Diagnosis not present

## 2019-02-11 DIAGNOSIS — E559 Vitamin D deficiency, unspecified: Secondary | ICD-10-CM | POA: Diagnosis not present

## 2019-02-11 DIAGNOSIS — Z6837 Body mass index (BMI) 37.0-37.9, adult: Secondary | ICD-10-CM

## 2019-02-11 DIAGNOSIS — R7303 Prediabetes: Secondary | ICD-10-CM

## 2019-02-11 DIAGNOSIS — Z9189 Other specified personal risk factors, not elsewhere classified: Secondary | ICD-10-CM

## 2019-02-11 MED ORDER — METFORMIN HCL 500 MG PO TABS: 500.0000 mg | ORAL_TABLET | Freq: Every day | ORAL | 0 refills | Status: DC

## 2019-02-11 MED ORDER — VITAMIN D (ERGOCALCIFEROL) 1.25 MG (50000 UNIT) PO CAPS: 50000.0000 [IU] | ORAL_CAPSULE | ORAL | 0 refills | Status: DC

## 2019-02-11 NOTE — Progress Notes (Signed)
Chief Complaint:   OBESITY Megan Hanna is here to discuss her progress with her obesity treatment plan along with follow-up of her obesity related diagnoses. Megan Hanna is on the [place] +100 calories and states she is following her eating plan approximately 100% of the time. Megan Hanna states she is exercising for 0 minutes 0 times per week.  Today's visit was #: 2 Starting weight: 248 lbs Starting date: 01/28/2019 Today's weight: 242 lbs Today's date: 02/11/2019 Total lbs lost to date: 6 lbs Total lbs lost since last in-office visit: 6 lbs  Interim History: Megan Hanna says she has been getting hungry between lunch and dinner.  She has been drinking lots of water.  She has been craving meat at times.  She reports that she felt her blood sugar may have dropped a few times.  Subjective:   1. Prediabetes Megan Hanna has a diagnosis of prediabetes based on her elevated HgA1c and was informed this puts her at greater risk of developing diabetes. She continues to work on diet and exercise to decrease her risk of diabetes. She denies nausea or hypoglycemia.  Lab Results  Component Value Date   HGBA1C 5.9 (H) 01/28/2019   Lab Results  Component Value Date   INSULIN 22.7 01/28/2019   2. Elevated LDL cholesterol level Megan Hanna has an elevated LDL and has been trying to improve her cholesterol levels with intensive lifestyle modification including a low saturated fat diet, exercise and weight loss. She denies any chest pain, claudication or myalgias.  Lab Results  Component Value Date   ALT 16 01/28/2019   AST 17 01/28/2019   ALKPHOS 83 01/28/2019   BILITOT 0.3 01/28/2019   Lab Results  Component Value Date   CHOL 184 01/28/2019   HDL 46 01/28/2019   LDLCALC 123 (H) 01/28/2019   TRIG 82 01/28/2019   3. Vitamin D deficiency Megan Hanna's Vitamin D level was 11.7 on 01/28/2019. She is not currently taking vit D. She denies nausea, vomiting or muscle weakness.  4. Dyspepsia Megan Hanna reports  that this has improved already with her new diet.  5. At risk for diabetes mellitus Megan Hanna is at higher than average risk for developing diabetes due to her obesity.   Assessment/Plan:   1. Prediabetes Good blood sugar control is important to decrease the likelihood of diabetic complications such as nephropathy, neuropathy, limb loss, blindness, coronary artery disease, and death. Intensive lifestyle modification including diet, exercise and weight loss are the first line of treatment for diabetes.   Orders - metFORMIN (GLUCOPHAGE) 500 MG tablet; Take 1 tablet (500 mg total) by mouth daily at 2 PM.  Dispense: 30 tablet; Refill: 0  2. Elevated LDL cholesterol level Cardiovascular risk and specific lipid/LDL goals reviewed.  We discussed several lifestyle modifications today and Megan Hanna will continue to work on diet, exercise and weight loss efforts. Orders and follow up as documented in patient record.   Counseling Intensive lifestyle modifications are the first line treatment for this issue. . Dietary changes: Increase soluble fiber. Decrease simple carbohydrates. . Exercise changes: Moderate to vigorous-intensity aerobic activity 150 minutes per week if tolerated. . Lipid-lowering medications: see documented in medical record.  3. Vitamin D deficiency Low Vitamin D level contributes to fatigue and are associated with obesity, breast, and colon cancer. She agrees to continue to take prescription Vitamin D @50 ,000 IU every week and will follow-up for routine testing of Vitamin D, at least 2-3 times per year to avoid over-replacement.  Orders - Vitamin D, Ergocalciferol, (  DRISDOL) 1.25 MG (50000 UNIT) CAPS capsule; Take 1 capsule (50,000 Units total) by mouth every 7 (seven) days.  Dispense: 4 capsule; Refill: 0  4. Dyspepsia Current treatment plan is effective, no change in therapy. Counseling: Intensive lifestyle modifications are the first line treatment for this issue. We discussed  several lifestyle modifications today and she will continue to work on diet, exercise and weight loss efforts. We will continue to monitor.  5. At risk for diabetes mellitus Megan Hanna was given approximately 15 minutes of diabetes education and counseling today. We discussed intensive lifestyle modifications today with an emphasis on weight loss as well as increasing exercise and decreasing simple carbohydrates in her diet. We also reviewed medication options with an emphasis on risk versus benefit of those discussed.   Repetitive spaced learning was employed today to elicit superior memory formation and behavioral change.  6. Class 2 severe obesity with serious comorbidity and body mass index (BMI) of 37.0 to 37.9 in adult, unspecified obesity type (HCC) Megan Hanna is currently in the action stage of change. As such, her goal is to continue with weight loss efforts. She has agreed to the Megan Hanna Plan +100 calories.   Exercise goals: For substantial health benefits, adults should do at least 150 minutes (2 hours and 30 minutes) a week of moderate-intensity, or 75 minutes (1 hour and 15 minutes) a week of vigorous-intensity aerobic physical activity, or an equivalent combination of moderate- and vigorous-intensity aerobic activity. Aerobic activity should be performed in episodes of at least 10 minutes, and preferably, it should be spread throughout the week. Adults should also include muscle-strengthening activities that involve all major muscle groups on 2 or more days a week.  Behavioral modification strategies: increasing lean protein intake.  Megan Hanna has agreed to follow-up with our clinic in 2 weeks. She was informed of the importance of frequent follow-up visits to maximize her success with intensive lifestyle modifications for her multiple health conditions.   Objective:   Blood pressure 118/74, pulse 78, temperature 98.2 F (36.8 C), temperature source Oral, height 5\' 7"  (1.702 m), weight  242 lb (109.8 kg), last menstrual period 01/23/2019, SpO2 100 %. Body mass index is 37.9 kg/m.  General: Cooperative, alert, well developed, in no acute distress. HEENT: Conjunctivae and lids unremarkable. Cardiovascular: Regular rhythm.  Lungs: Normal work of breathing. Neurologic: No focal deficits.   Lab Results  Component Value Date   CREATININE 0.85 01/28/2019   BUN 13 01/28/2019   NA 139 01/28/2019   K 4.7 01/28/2019   CL 102 01/28/2019   CO2 21 01/28/2019   Lab Results  Component Value Date   ALT 16 01/28/2019   AST 17 01/28/2019   ALKPHOS 83 01/28/2019   BILITOT 0.3 01/28/2019   Lab Results  Component Value Date   HGBA1C 5.9 (H) 01/28/2019   Lab Results  Component Value Date   INSULIN 22.7 01/28/2019   Lab Results  Component Value Date   TSH 2.550 01/28/2019   Lab Results  Component Value Date   CHOL 184 01/28/2019   HDL 46 01/28/2019   LDLCALC 123 (H) 01/28/2019   TRIG 82 01/28/2019   Lab Results  Component Value Date   WBC 5.0 01/28/2019   HGB 13.7 01/28/2019   HCT 42.6 01/28/2019   MCV 87 01/28/2019   PLT 418 01/28/2019   Lab Results  Component Value Date   IRON 51 01/28/2019   TIBC 283 01/28/2019   FERRITIN 136 01/28/2019   Attestation Statements:  Reviewed by clinician on day of visit: allergies, medications, problem list, medical history, surgical history, family history, social history, and previous encounter notes.  I, Water quality scientist, CMA, am acting as Location manager for [employer], DO.  I have reviewed the above documentation for accuracy and completeness, and I agree with the above. Briscoe Deutscher, DO

## 2019-02-18 ENCOUNTER — Ambulatory Visit (INDEPENDENT_AMBULATORY_CARE_PROVIDER_SITE_OTHER): Payer: Self-pay | Admitting: Psychology

## 2019-02-18 ENCOUNTER — Telehealth (INDEPENDENT_AMBULATORY_CARE_PROVIDER_SITE_OTHER): Payer: Self-pay | Admitting: Psychology

## 2019-02-18 NOTE — Telephone Encounter (Signed)
  Office: [phone]  /  Fax: [phone]  Date of Call: February 18, 2019  Time of Call: 2:33pm Provider: Lawerance Cruel, PsyD  CONTENT: This provider called Colin Mulders to check-in as she did not present for today's Webex appointment at 2:30pm. A HIPAA compliant voicemail was left requesting a call back. Of note, this provider stayed on the WebEx appointment for 5 minutes prior to signing off per the clinic's grace period policy.    PLAN: This provider will wait for Eleanore to call back. No further follow-up planned by this provider.

## 2019-02-24 ENCOUNTER — Other Ambulatory Visit: Payer: Self-pay

## 2019-02-24 ENCOUNTER — Encounter (INDEPENDENT_AMBULATORY_CARE_PROVIDER_SITE_OTHER): Payer: Self-pay | Admitting: Family Medicine

## 2019-02-24 ENCOUNTER — Ambulatory Visit (INDEPENDENT_AMBULATORY_CARE_PROVIDER_SITE_OTHER): Payer: 59 | Admitting: Family Medicine

## 2019-02-24 VITALS — BP 119/81 | HR 68 | Temp 98.2°F | Ht 67.0 in | Wt 239.0 lb

## 2019-02-24 DIAGNOSIS — R1013 Epigastric pain: Secondary | ICD-10-CM

## 2019-02-24 DIAGNOSIS — Z9189 Other specified personal risk factors, not elsewhere classified: Secondary | ICD-10-CM | POA: Diagnosis not present

## 2019-02-24 DIAGNOSIS — E559 Vitamin D deficiency, unspecified: Secondary | ICD-10-CM

## 2019-02-24 DIAGNOSIS — R7303 Prediabetes: Secondary | ICD-10-CM

## 2019-02-24 DIAGNOSIS — Z6837 Body mass index (BMI) 37.0-37.9, adult: Secondary | ICD-10-CM

## 2019-02-24 MED ORDER — VITAMIN D (ERGOCALCIFEROL) 1.25 MG (50000 UNIT) PO CAPS: 50000.0000 [IU] | ORAL_CAPSULE | ORAL | 0 refills | Status: DC

## 2019-02-24 MED ORDER — METFORMIN HCL 1000 MG PO TABS: 1000.0000 mg | ORAL_TABLET | Freq: Every day | ORAL | 0 refills | Status: DC

## 2019-02-25 NOTE — Progress Notes (Signed)
Chief Complaint:   OBESITY Megan Hanna is here to discuss her progress with her obesity treatment plan along with follow-up of her obesity related diagnoses. Megan Hanna is on the [place] and states she is following her eating plan approximately 95% of the time. Megan Hanna states she is exercising for 0 minutes 0 times per week.  Today's visit was #: 3 Starting weight: 248 lbs Starting date: 01/28/2019 Today's weight: 239 lbs Today's date: 02/24/2019 Total lbs lost to date: 9 lbs Total lbs lost since last in-office visit: 3 lbs  Interim History: Megan Hanna has been working hard to make great decisions.  She says she gets hungry at night.  Subjective:   1. Prediabetes Megan Hanna has a diagnosis of prediabetes based on her elevated HgA1c and was informed this puts her at greater risk of developing diabetes. She continues to work on diet and exercise to decrease her risk of diabetes. She denies nausea or hypoglycemia.  She is taking metformin at 2 pm most days.  Lab Results  Component Value Date   HGBA1C 5.9 (H) 01/28/2019   Lab Results  Component Value Date   INSULIN 22.7 01/28/2019   2. Vitamin D deficiency Megan Hanna's Vitamin D level was 11.7 on 01/28/2019. She is currently taking vit D. She denies nausea, vomiting or muscle weakness.  3. Dyspepsia This is stable at this time.  4. At risk for diabetes mellitus Megan Hanna is at higher than average risk for developing diabetes due to her obesity.   Assessment/Plan:   1. Prediabetes Megan Hanna will continue to work on weight loss, exercise, and decreasing simple carbohydrates to help decrease the risk of diabetes.  Will increase metformin, as below.  Orders - metFORMIN (GLUCOPHAGE) 1000 MG tablet; Take 1 tablet (1,000 mg total) by mouth daily at 2 PM.  Dispense: 60 tablet; Refill: 0  2. Vitamin D deficiency Low Vitamin D level contributes to fatigue and are associated with obesity, breast, and colon cancer. She agrees to continue to  take prescription Vitamin D @50 ,000 IU every week and will follow-up for routine testing of Vitamin D, at least 2-3 times per year to avoid over-replacement.  Orders - Vitamin D, Ergocalciferol, (DRISDOL) 1.25 MG (50000 UNIT) CAPS capsule; Take 1 capsule (50,000 Units total) by mouth every 7 (seven) days.  Dispense: 4 capsule; Refill: 0  3. Dyspepsia Will continue to monitor.  4. At risk for diabetes mellitus Megan Hanna was given approximately 15 minutes of diabetes education and counseling today. We discussed intensive lifestyle modifications today with an emphasis on weight loss as well as increasing exercise and decreasing simple carbohydrates in her diet. We also reviewed medication options with an emphasis on risk versus benefit of those discussed.   Repetitive spaced learning was employed today to elicit superior memory formation and behavioral change.  5. Class 2 severe obesity with serious comorbidity and body mass index (BMI) of 37.0 to 37.9 in adult, unspecified obesity type (HCC) Megan Hanna is currently in the action stage of change. As such, her goal is to continue with weight loss efforts. She has agreed to the [place] +100 calories.   Exercise goals: Walking/boxing 3 times a week for 45 minutes.  Behavioral modification strategies: increasing lean protein intake and increasing water intake.  Megan Hanna has agreed to follow-up with our clinic in 2 weeks. She was informed of the importance of frequent follow-up visits to maximize her success with intensive lifestyle modifications for her multiple health conditions.   Objective:   Blood pressure 119/81,  pulse 68, temperature 98.2 F (36.8 C), temperature source Oral, height 5\' 7"  (1.702 m), weight 239 lb (108.4 kg), last menstrual period 02/21/2019, SpO2 100 %. Body mass index is 37.43 kg/m.  General: Cooperative, alert, well developed, in no acute distress. HEENT: Conjunctivae and lids unremarkable. Cardiovascular: Regular  rhythm.  Lungs: Normal work of breathing. Neurologic: No focal deficits.   Lab Results  Component Value Date   CREATININE 0.85 01/28/2019   BUN 13 01/28/2019   NA 139 01/28/2019   K 4.7 01/28/2019   CL 102 01/28/2019   CO2 21 01/28/2019   Lab Results  Component Value Date   ALT 16 01/28/2019   AST 17 01/28/2019   ALKPHOS 83 01/28/2019   BILITOT 0.3 01/28/2019   Lab Results  Component Value Date   HGBA1C 5.9 (H) 01/28/2019   Lab Results  Component Value Date   INSULIN 22.7 01/28/2019   Lab Results  Component Value Date   TSH 2.550 01/28/2019   Lab Results  Component Value Date   CHOL 184 01/28/2019   HDL 46 01/28/2019   LDLCALC 123 (H) 01/28/2019   TRIG 82 01/28/2019   Lab Results  Component Value Date   WBC 5.0 01/28/2019   HGB 13.7 01/28/2019   HCT 42.6 01/28/2019   MCV 87 01/28/2019   PLT 418 01/28/2019   Lab Results  Component Value Date   IRON 51 01/28/2019   TIBC 283 01/28/2019   FERRITIN 136 01/28/2019   Attestation Statements:   Reviewed by clinician on day of visit: allergies, medications, problem list, medical history, surgical history, family history, social history, and previous encounter notes.  I, 01/30/2019, CMA, am acting as Insurance claims handler for Energy manager, DO.  I have reviewed the above documentation for accuracy and completeness, and I agree with the above. [employer], DO

## 2019-03-12 ENCOUNTER — Encounter (INDEPENDENT_AMBULATORY_CARE_PROVIDER_SITE_OTHER): Payer: Self-pay | Admitting: Family Medicine

## 2019-03-12 ENCOUNTER — Ambulatory Visit (INDEPENDENT_AMBULATORY_CARE_PROVIDER_SITE_OTHER): Payer: 59 | Admitting: Family Medicine

## 2019-03-12 ENCOUNTER — Other Ambulatory Visit: Payer: Self-pay

## 2019-03-12 VITALS — BP 91/62 | HR 85 | Temp 98.4°F | Ht 67.0 in | Wt 234.0 lb

## 2019-03-12 DIAGNOSIS — R7303 Prediabetes: Secondary | ICD-10-CM | POA: Diagnosis not present

## 2019-03-12 DIAGNOSIS — E559 Vitamin D deficiency, unspecified: Secondary | ICD-10-CM | POA: Diagnosis not present

## 2019-03-12 DIAGNOSIS — I9589 Other hypotension: Secondary | ICD-10-CM | POA: Diagnosis not present

## 2019-03-12 DIAGNOSIS — Z6836 Body mass index (BMI) 36.0-36.9, adult: Secondary | ICD-10-CM

## 2019-03-12 MED ORDER — VITAMIN D (ERGOCALCIFEROL) 1.25 MG (50000 UNIT) PO CAPS: 50000.0000 [IU] | ORAL_CAPSULE | ORAL | 0 refills | Status: DC

## 2019-03-12 NOTE — Progress Notes (Signed)
Chief Complaint:   OBESITY Megan Hanna is here to discuss her progress with her obesity treatment plan along with follow-up of her obesity related diagnoses. Megan Hanna is on the [employer] and states she is following her eating plan approximately 30% of the time. Megan Hanna states she is walking for 30 minutes 3 times per week.  Today's visit was #: 4 Starting weight: 248 lbs Starting date: 01/28/2019 Today's weight: 234 lbs Today's date: 03/12/2019 Total lbs lost to date: 14 lbs Total lbs lost since last in-office visit: 5 lbs  Interim History: Megan Hanna says she has been drinking 4 bottles of water a day.  She stopped sugary drinks completely.  She is making good choices.  She recently got pound cake to support a friend's business.  She wrapped slices for extended family and only had one piece.  Subjective:   1. Vitamin D deficiency Megan Hanna's Vitamin D level was 11.7 on 01/28/2019. She is currently taking vit D. She denies nausea, vomiting or muscle weakness.  2. Prediabetes Megan Hanna has a diagnosis of prediabetes based on her elevated HgA1c and was informed this puts her at greater risk of developing diabetes. She continues to work on diet and exercise to decrease her risk of diabetes. She denies nausea or hypoglycemia.  She is taking metformin 1000 mg daily and says she has decreased appetite.  Lab Results  Component Value Date   HGBA1C 5.9 (H) 01/28/2019   Lab Results  Component Value Date   INSULIN 22.7 01/28/2019   3. Other specified hypotension Asymptomatic at this time.  BP Readings from Last 3 Encounters:  03/12/19 91/62  02/24/19 119/81  02/11/19 118/74   Assessment/Plan:   1. Vitamin D deficiency Low Vitamin D level contributes to fatigue and are associated with obesity, breast, and colon cancer. She agrees to continue to take prescription Vitamin D @50 ,000 IU every week and will follow-up for routine testing of Vitamin D, at least 2-3 times per year to avoid  over-replacement.  Orders - Vitamin D, Ergocalciferol, (DRISDOL) 1.25 MG (50000 UNIT) CAPS capsule; Take 1 capsule (50,000 Units total) by mouth every 7 (seven) days.  Dispense: 4 capsule; Refill: 0  2. Prediabetes Megan Hanna will continue to work on weight loss, exercise, and decreasing simple carbohydrates to help decrease the risk of diabetes. Current treatment plan is effective, no change in therapy. Counseling: Intensive lifestyle modifications are the first line treatment for this issue. We discussed several lifestyle modifications today and she will continue to work on diet, exercise and weight loss efforts. We will continue to monitor. Orders and follow up as documented in patient record..  3. Other specified hypotension She will increase water and salt if she has any symptoms.  Will continue to monitor.  4. Class 2 severe obesity with serious comorbidity and body mass index (BMI) of 36.0 to 36.9 in adult, unspecified obesity type (HCC) Megan Hanna is currently in the action stage of change. As such, her goal is to continue with weight loss efforts. She has agreed to the [employer].   Exercise goals: As is.  Behavioral modification strategies: increasing lean protein intake and increasing water intake.  Megan Hanna has agreed to follow-up with our clinic in 2 weeks. She was informed of the importance of frequent follow-up visits to maximize her success with intensive lifestyle modifications for her multiple health conditions.   Objective:   Blood pressure 91/62, pulse 85, temperature 98.4 F (36.9 C), temperature source Oral, height 5\' 7"  (1.702 m), weight 234  lb (106.1 kg), last menstrual period 02/21/2019, SpO2 98 %. Body mass index is 36.65 kg/m.  General: Cooperative, alert, well developed, in no acute distress. HEENT: Conjunctivae and lids unremarkable. Cardiovascular: Regular rhythm.  Lungs: Normal work of breathing. Neurologic: No focal deficits.   Lab Results  Component  Value Date   CREATININE 0.85 01/28/2019   BUN 13 01/28/2019   NA 139 01/28/2019   K 4.7 01/28/2019   CL 102 01/28/2019   CO2 21 01/28/2019   Lab Results  Component Value Date   ALT 16 01/28/2019   AST 17 01/28/2019   ALKPHOS 83 01/28/2019   BILITOT 0.3 01/28/2019   Lab Results  Component Value Date   HGBA1C 5.9 (H) 01/28/2019   Lab Results  Component Value Date   INSULIN 22.7 01/28/2019   Lab Results  Component Value Date   TSH 2.550 01/28/2019   Lab Results  Component Value Date   CHOL 184 01/28/2019   HDL 46 01/28/2019   LDLCALC 123 (H) 01/28/2019   TRIG 82 01/28/2019   Lab Results  Component Value Date   WBC 5.0 01/28/2019   HGB 13.7 01/28/2019   HCT 42.6 01/28/2019   MCV 87 01/28/2019   PLT 418 01/28/2019   Lab Results  Component Value Date   IRON 51 01/28/2019   TIBC 283 01/28/2019   FERRITIN 136 01/28/2019   Attestation Statements:   Reviewed by clinician on day of visit: allergies, medications, problem list, medical history, surgical history, family history, social history, and previous encounter notes.  CPT Code 1) Number and Complexity of Problems Addressed 2) Amount and/or Complexity of Data to be Reviewed and Analyzed 3) Risk of Complications and/or Morbidity or Mortality of Patient Management  [phone]  or  84696 Low  []   2 or more self-limited or minor problems []   1 stable chronic illness []   1 acute, uncomplicated illness or injury Limited (Must meet the requirements of at least 1 out of 2 categories)  Category 1:  Any combination of 2 from the following:  []   Review of prior external note(s) from unique source []   Review of the result(s) of unique test  []   Ordering of each unique test OR Category 2:  []   Assessment requiring an independent historian Low risk of morbidity from additional diagnostic testing or treatment  99204  or  Moderate  []   1 or more chronic illnesses with exacerbation, progression, or side effects of  treatment [x]   2 or more stable chronic illnesses [x]   1 undiagnosed new problem with uncertain prognosis []   1 acute illness with systemic symptoms []   1 acute complicated injury Moderate (Must meet the requirements of at least 1 out of 3 categories)  Category 1:  Any combination of 3 from the following: []   Review of prior external note(s) from unique source []   Review of the result(s) of unique test []   Ordering of each test []   Assessment requiring an independent historian OR Category 2:  []   Independent interpretation of tests Independent interpretation of a test performed by another physician/other qualified health care professional (not separately reported)  OR Category 3:  []   Discussion of management or test interpretation  Moderate risk of morbidity from additional diagnostic testing or treatment   Examples:  [x]   Prescription drug management  []   Diagnosis or treatment significantly limited by social determinants of health   I, , CMA, am acting as for , DO.  I  have reviewed the above documentation for accuracy and completeness, and I agree with the above. Briscoe Deutscher, DO

## 2019-04-02 ENCOUNTER — Encounter (INDEPENDENT_AMBULATORY_CARE_PROVIDER_SITE_OTHER): Payer: Self-pay | Admitting: Family Medicine

## 2019-04-02 ENCOUNTER — Ambulatory Visit (INDEPENDENT_AMBULATORY_CARE_PROVIDER_SITE_OTHER): Payer: 59 | Admitting: Family Medicine

## 2019-04-02 ENCOUNTER — Other Ambulatory Visit: Payer: Self-pay

## 2019-04-02 VITALS — BP 108/76 | HR 67 | Temp 98.4°F | Ht 67.0 in | Wt 234.0 lb

## 2019-04-02 DIAGNOSIS — E7800 Pure hypercholesterolemia, unspecified: Secondary | ICD-10-CM | POA: Diagnosis not present

## 2019-04-02 DIAGNOSIS — E559 Vitamin D deficiency, unspecified: Secondary | ICD-10-CM

## 2019-04-02 DIAGNOSIS — R7303 Prediabetes: Secondary | ICD-10-CM

## 2019-04-02 DIAGNOSIS — Z6836 Body mass index (BMI) 36.0-36.9, adult: Secondary | ICD-10-CM

## 2019-04-02 MED ORDER — VITAMIN D (ERGOCALCIFEROL) 1.25 MG (50000 UNIT) PO CAPS: 50000.0000 [IU] | ORAL_CAPSULE | ORAL | 0 refills | Status: DC

## 2019-04-02 NOTE — Progress Notes (Signed)
Chief Complaint:   OBESITY Megan Hanna is here to discuss her progress with her obesity treatment plan along with follow-up of her obesity related diagnoses. Megan Hanna is on the [employer] and states she is following her eating plan approximately 80% of the time. Megan Hanna states she is walking for 30 minutes 3 times per week.  Today's visit was #: 5 Starting weight: 248 lbs Starting date: 01/28/2019 Today's weight: 234 lbs Today's date: 04/02/2019 Total lbs lost to date: 14 lbs Total lbs lost since last in-office visit: 0  Interim History: Megan Hanna reports that her water intake has been lower due to experiencing nausea with water.  She has been getting less sleep recently.  She is up 1.2 pound  She provided the following food recall:  Breakfast:  100 calorie snack. Lunch:  Skips. Dinner (starving):  [place] burger and green beans.  Subjective:   1. Vitamin D deficiency Megan Hanna's Vitamin D level was 11.7 on 01/28/2019. She is currently taking prescription vitamin D 50,000 IU each week. She denies nausea, vomiting or muscle weakness.  2. Prediabetes Megan Hanna has a diagnosis of prediabetes based on her elevated HgA1c and was informed this puts her at greater risk of developing diabetes. She continues to work on diet and exercise to decrease her risk of diabetes. She denies nausea or hypoglycemia.  She is taking metformin 1000 mg daily.  Lab Results  Component Value Date   HGBA1C 5.9 (H) 01/28/2019   Lab Results  Component Value Date   INSULIN 22.7 01/28/2019   3. Elevated LDL cholesterol level Megan Hanna has an elevated LDL and has been trying to improve her cholesterol levels with intensive lifestyle modification including a low saturated fat diet, exercise and weight loss. She denies any chest pain, claudication or myalgias.  Lab Results  Component Value Date   ALT 16 01/28/2019   AST 17 01/28/2019   ALKPHOS 83 01/28/2019   BILITOT 0.3 01/28/2019   Lab Results  Component  Value Date   CHOL 184 01/28/2019   HDL 46 01/28/2019   LDLCALC 123 (H) 01/28/2019   TRIG 82 01/28/2019   Assessment/Plan:   1. Vitamin D deficiency Low Vitamin D level contributes to fatigue and are associated with obesity, breast, and colon cancer. She agrees to continue to take prescription Vitamin D @50 ,000 IU every week and will follow-up for routine testing of Vitamin D, at least 2-3 times per year to avoid over-replacement.  Orders - Vitamin D, Ergocalciferol, (DRISDOL) 1.25 MG (50000 UNIT) CAPS capsule; Take 1 capsule (50,000 Units total) by mouth every 7 (seven) days.  Dispense: 4 capsule; Refill: 0  2. Prediabetes Megan Hanna will continue to work on weight loss, exercise, and decreasing simple carbohydrates to help decrease the risk of diabetes.   3. Elevated LDL cholesterol level Cardiovascular risk and specific lipid/LDL goals reviewed.  We discussed several lifestyle modifications today and Megan Hanna will continue to work on diet, exercise and weight loss efforts. Orders and follow up as documented in patient record.   Counseling Intensive lifestyle modifications are the first line treatment for this issue. . Dietary changes: Increase soluble fiber. Decrease simple carbohydrates. . Exercise changes: Moderate to vigorous-intensity aerobic activity 150 minutes per week if tolerated. . Lipid-lowering medications: see documented in medical record.  4. Class 2 severe obesity with serious comorbidity and body mass index (BMI) of 36.0 to 36.9 in adult, unspecified obesity type (HCC) Megan Hanna is currently in the action stage of change. As such, her goal is to  continue with weight loss efforts. She has agreed to the [employer].   Exercise goals: As is.  Behavioral modification strategies: increasing lean protein intake, increasing water intake and no skipping meals.  Megan Hanna has agreed to follow-up with our clinic in 2 weeks. She was informed of the importance of frequent  follow-up visits to maximize her success with intensive lifestyle modifications for her multiple health conditions.   Objective:   Blood pressure 108/76, pulse 67, temperature 98.4 F (36.9 C), temperature source Oral, height 5\' 7"  (1.702 m), weight 234 lb (106.1 kg), last menstrual period 03/21/2019, SpO2 97 %. Body mass index is 36.65 kg/m.  General: Cooperative, alert, well developed, in no acute distress. HEENT: Conjunctivae and lids unremarkable. Cardiovascular: Regular rhythm.  Lungs: Normal work of breathing. Neurologic: No focal deficits.   Lab Results  Component Value Date   CREATININE 0.85 01/28/2019   BUN 13 01/28/2019   NA 139 01/28/2019   K 4.7 01/28/2019   CL 102 01/28/2019   CO2 21 01/28/2019   Lab Results  Component Value Date   ALT 16 01/28/2019   AST 17 01/28/2019   ALKPHOS 83 01/28/2019   BILITOT 0.3 01/28/2019   Lab Results  Component Value Date   HGBA1C 5.9 (H) 01/28/2019   Lab Results  Component Value Date   INSULIN 22.7 01/28/2019   Lab Results  Component Value Date   TSH 2.550 01/28/2019   Lab Results  Component Value Date   CHOL 184 01/28/2019   HDL 46 01/28/2019   LDLCALC 123 (H) 01/28/2019   TRIG 82 01/28/2019   Lab Results  Component Value Date   WBC 5.0 01/28/2019   HGB 13.7 01/28/2019   HCT 42.6 01/28/2019   MCV 87 01/28/2019   PLT 418 01/28/2019   Lab Results  Component Value Date   IRON 51 01/28/2019   TIBC 283 01/28/2019   FERRITIN 136 01/28/2019   Attestation Statements:   Reviewed by clinician on day of visit: allergies, medications, problem list, medical history, surgical history, family history, social history, and previous encounter notes.  I, Water quality scientist, CMA, am acting as Location manager for [employer], DO.  I have reviewed the above documentation for accuracy and completeness, and I agree with the above. Briscoe Deutscher, DO

## 2019-04-28 ENCOUNTER — Ambulatory Visit (INDEPENDENT_AMBULATORY_CARE_PROVIDER_SITE_OTHER): Payer: 59 | Admitting: Family Medicine

## 2019-04-28 ENCOUNTER — Encounter (INDEPENDENT_AMBULATORY_CARE_PROVIDER_SITE_OTHER): Payer: Self-pay | Admitting: Family Medicine

## 2019-04-28 ENCOUNTER — Other Ambulatory Visit: Payer: Self-pay

## 2019-04-28 VITALS — BP 116/77 | HR 78 | Temp 98.4°F | Ht 67.0 in | Wt 231.0 lb

## 2019-04-28 DIAGNOSIS — R7303 Prediabetes: Secondary | ICD-10-CM

## 2019-04-28 DIAGNOSIS — Z9189 Other specified personal risk factors, not elsewhere classified: Secondary | ICD-10-CM | POA: Diagnosis not present

## 2019-04-28 DIAGNOSIS — Z6836 Body mass index (BMI) 36.0-36.9, adult: Secondary | ICD-10-CM

## 2019-04-28 DIAGNOSIS — G44209 Tension-type headache, unspecified, not intractable: Secondary | ICD-10-CM | POA: Diagnosis not present

## 2019-04-28 DIAGNOSIS — E559 Vitamin D deficiency, unspecified: Secondary | ICD-10-CM | POA: Diagnosis not present

## 2019-04-28 DIAGNOSIS — E7800 Pure hypercholesterolemia, unspecified: Secondary | ICD-10-CM | POA: Diagnosis not present

## 2019-04-28 NOTE — Progress Notes (Signed)
Chief Complaint:   OBESITY Megan Hanna is here to discuss her progress with her obesity treatment plan along with follow-up of her obesity related diagnoses. Megan Hanna is on the [employer] and states she is following her eating plan approximately 80% of the time. Megan Hanna states she is walking for 30 minutes 2 times per week.  Today's visit was #: 6 Starting weight: 248 lbs Starting date: 01/28/2019 Today's weight: 231 lbs Today's date: 04/28/2019 Total lbs lost to date: 17 lbs Total lbs lost since last in-office visit: 3 lbs  Interim History: Megan Hanna reports that she is starting skating lessons with her daughter next week.  She is noticing a headache around 2 pm each day.  She is wondering if it is related to the metformin.  She has adjusted her meals to make sure it is not due to low blood sugar.  She has increased her water intake.  Subjective:   1. Prediabetes Megan Hanna has a diagnosis of prediabetes based on her elevated HgA1c and was informed this puts her at greater risk of developing diabetes. She continues to work on diet and exercise to decrease her risk of diabetes. She denies nausea or hypoglycemia.  She is taking metformin 1000 mg daily.  Lab Results  Component Value Date   HGBA1C 5.9 (H) 01/28/2019   Lab Results  Component Value Date   INSULIN 22.7 01/28/2019   2. Elevated LDL cholesterol level Megan Hanna has an elevated LDL of 123.  Lab Results  Component Value Date   ALT 16 01/28/2019   AST 17 01/28/2019   ALKPHOS 83 01/28/2019   BILITOT 0.3 01/28/2019   Lab Results  Component Value Date   CHOL 184 01/28/2019   HDL 46 01/28/2019   LDLCALC 123 (H) 01/28/2019   TRIG 82 01/28/2019   3. Vitamin D deficiency Megan Hanna's Vitamin D level was 11.7 on 01/28/2019. She is currently taking prescription vitamin D 50,000 IU each week. She denies nausea, vomiting or muscle weakness.  4. Tension headache Megan Hanna describes the pain as band-like.  Occurs several days a  week.  Chronic.  More likely related to computer monitor height.  Assessment/Plan:   1. Prediabetes Megan Hanna will continue to work on weight loss, exercise, and decreasing simple carbohydrates to help decrease the risk of diabetes.   Orders - Hemoglobin A1c  2. Elevated LDL cholesterol level Will continue to monitor.  3. Vitamin D deficiency Low Vitamin D level contributes to fatigue and are associated with obesity, breast, and colon cancer. She agrees to continue to take prescription Vitamin D @50 ,000 IU every week and will follow-up for routine testing of Vitamin D, at least 2-3 times per year to avoid over-replacement.  Orders - VITAMIN D 25 Hydroxy (Vit-D Deficiency, Fractures)  4. Tension headache Reviewed ergonomics.  5. At risk for heart disease Megan Hanna was given approximately 15 minutes of coronary artery disease prevention counseling today. She is 39 y.o. female and has risk factors for heart disease including obesity. We discussed intensive lifestyle modifications today with an emphasis on specific weight loss instructions and strategies.   Repetitive spaced learning was employed today to elicit superior memory formation and behavioral change.  6. Class 2 severe obesity with serious comorbidity and body mass index (BMI) of 36.0 to 36.9 in adult, unspecified obesity type (HCC) Megan Hanna is currently in the action stage of change. As such, her goal is to continue with weight loss efforts. She has agreed to the [employer].   Exercise goals: For  substantial health benefits, adults should do at least 150 minutes (2 hours and 30 minutes) a week of moderate-intensity, or 75 minutes (1 hour and 15 minutes) a week of vigorous-intensity aerobic physical activity, or an equivalent combination of moderate- and vigorous-intensity aerobic activity. Aerobic activity should be performed in episodes of at least 10 minutes, and preferably, it should be spread throughout the  week.  Behavioral modification strategies: increasing lean protein intake and increasing water intake.  Megan Hanna has agreed to follow-up with our clinic in 2 weeks. She was informed of the importance of frequent follow-up visits to maximize her success with intensive lifestyle modifications for her multiple health conditions.   Megan Hanna was informed we would discuss her lab results at her next visit unless there is a critical issue that needs to be addressed sooner. Megan Hanna agreed to keep her next visit at the agreed upon time to discuss these results.  Objective:   Blood pressure 116/77, pulse 78, temperature 98.4 F (36.9 C), temperature source Oral, height 5\' 7"  (1.702 m), weight 231 lb (104.8 kg), last menstrual period 04/18/2019, SpO2 99 %. Body mass index is 36.18 kg/m.  General: Cooperative, alert, well developed, in no acute distress. HEENT: Conjunctivae and lids unremarkable. Cardiovascular: Regular rhythm.  Lungs: Normal work of breathing. Neurologic: No focal deficits.   Lab Results  Component Value Date   CREATININE 0.85 01/28/2019   BUN 13 01/28/2019   NA 139 01/28/2019   K 4.7 01/28/2019   CL 102 01/28/2019   CO2 21 01/28/2019   Lab Results  Component Value Date   ALT 16 01/28/2019   AST 17 01/28/2019   ALKPHOS 83 01/28/2019   BILITOT 0.3 01/28/2019   Lab Results  Component Value Date   HGBA1C 5.9 (H) 01/28/2019   Lab Results  Component Value Date   INSULIN 22.7 01/28/2019   Lab Results  Component Value Date   TSH 2.550 01/28/2019   Lab Results  Component Value Date   CHOL 184 01/28/2019   HDL 46 01/28/2019   LDLCALC 123 (H) 01/28/2019   TRIG 82 01/28/2019   Lab Results  Component Value Date   WBC 5.0 01/28/2019   HGB 13.7 01/28/2019   HCT 42.6 01/28/2019   MCV 87 01/28/2019   PLT 418 01/28/2019   Lab Results  Component Value Date   IRON 51 01/28/2019   TIBC 283 01/28/2019   FERRITIN 136 01/28/2019   Attestation Statements:    Reviewed by clinician on day of visit: allergies, medications, problem list, medical history, surgical history, family history, social history, and previous encounter notes.  I, 01/30/2019, CMA, am acting as Insurance claims handler for Energy manager, DO.  I have reviewed the above documentation for accuracy and completeness, and I agree with the above. [employer], DO

## 2019-04-29 LAB — HEMOGLOBIN A1C
Est. average glucose Bld gHb Est-mCnc: 117 mg/dL
Hgb A1c MFr Bld: 5.7 % — ABNORMAL HIGH (ref 4.8–5.6)

## 2019-04-29 LAB — VITAMIN D 25 HYDROXY (VIT D DEFICIENCY, FRACTURES): Vit D, 25-Hydroxy: 29.7 ng/mL — ABNORMAL LOW (ref 30.0–100.0)

## 2019-05-19 ENCOUNTER — Ambulatory Visit (INDEPENDENT_AMBULATORY_CARE_PROVIDER_SITE_OTHER): Payer: 59 | Admitting: Family Medicine

## 2019-05-19 ENCOUNTER — Other Ambulatory Visit: Payer: Self-pay

## 2019-05-19 ENCOUNTER — Encounter (INDEPENDENT_AMBULATORY_CARE_PROVIDER_SITE_OTHER): Payer: Self-pay | Admitting: Family Medicine

## 2019-05-19 VITALS — BP 118/80 | HR 74 | Temp 97.9°F | Ht 67.0 in | Wt 232.0 lb

## 2019-05-19 DIAGNOSIS — E559 Vitamin D deficiency, unspecified: Secondary | ICD-10-CM | POA: Diagnosis not present

## 2019-05-19 DIAGNOSIS — Z9189 Other specified personal risk factors, not elsewhere classified: Secondary | ICD-10-CM | POA: Diagnosis not present

## 2019-05-19 DIAGNOSIS — R7303 Prediabetes: Secondary | ICD-10-CM | POA: Diagnosis not present

## 2019-05-19 DIAGNOSIS — E66812 Obesity, class 2: Secondary | ICD-10-CM

## 2019-05-19 DIAGNOSIS — Z6836 Body mass index (BMI) 36.0-36.9, adult: Secondary | ICD-10-CM

## 2019-05-19 MED ORDER — VITAMIN D (ERGOCALCIFEROL) 1.25 MG (50000 UNIT) PO CAPS: 50000.0000 [IU] | ORAL_CAPSULE | ORAL | 0 refills | Status: DC

## 2019-05-19 NOTE — Progress Notes (Signed)
Chief Complaint:   OBESITY Megan Hanna is here to discuss her progress with her obesity treatment plan along with follow-up of her obesity related diagnoses. Megan Hanna is on the [employer] and states she is following her eating plan approximately 80% of the time. Megan Hanna states she is skating for 90 minutes 1 time per week and walking for 30 minutes 2 times per week.  Today's visit was #: 7 Starting weight: 248 lbs Starting date: 01/28/2019 Today's weight: 232 lbs Today's date: 05/19/2019 Total lbs lost to date: 16 lbs Total lbs lost since last in-office visit: 0  Interim History: Donnika says she is surprised by the lack of total weight loss.  She is eating well and exercising regularly.  She fell while skating and has a bandaged knee, but this is improving.  She decreased her water intake this time.  bioimpedence scale reveals decreased fat and increased muscle since last visit.  Subjective:   1. Vitamin D deficiency Megan Hanna's Vitamin D level was 29.7 on 04/28/2019. She is currently taking prescription vitamin D 50,000 IU each week. She denies nausea, vomiting or muscle weakness.  2. Prediabetes Megan Hanna has a diagnosis of prediabetes based on her elevated HgA1c and was informed this puts her at greater risk of developing diabetes. She continues to work on diet and exercise to decrease her risk of diabetes. She denies nausea or hypoglycemia.  She is taking metformin 1000 mg daily.  Lab Results  Component Value Date   HGBA1C 5.7 (H) 04/28/2019   Lab Results  Component Value Date   INSULIN 22.7 01/28/2019   3. At risk for diabetes mellitus Megan Hanna is at higher than average risk for developing diabetes due to her obesity.   Assessment/Plan:   1. Vitamin D deficiency Low Vitamin D level contributes to fatigue and are associated with obesity, breast, and colon cancer. She agrees to continue to take prescription Vitamin D @50 ,000 IU every week and will follow-up for routine  testing of Vitamin D, at least 2-3 times per year to avoid over-replacement.  Orders - Vitamin D, Ergocalciferol, (DRISDOL) 1.25 MG (50000 UNIT) CAPS capsule; Take 1 capsule (50,000 Units total) by mouth every 7 (seven) days.  Dispense: 4 capsule; Refill: 0  2. Prediabetes Megan Hanna will continue to work on weight loss, exercise, and decreasing simple carbohydrates to help decrease the risk of diabetes.   3. At risk for diabetes mellitus Megan Hanna was given approximately 15 minutes of diabetes education and counseling today. We discussed intensive lifestyle modifications today with an emphasis on weight loss as well as increasing exercise and decreasing simple carbohydrates in her diet. We also reviewed medication options with an emphasis on risk versus benefit of those discussed.   Repetitive spaced learning was employed today to elicit superior memory formation and behavioral change.  4. Class 2 severe obesity with serious comorbidity and body mass index (BMI) of 36.0 to 36.9 in adult, unspecified obesity type (HCC) Megan Hanna is currently in the action stage of change. As such, her goal is to continue with weight loss efforts. She has agreed to the [employer].   Exercise goals: For substantial health benefits, adults should do at least 150 minutes (2 hours and 30 minutes) a week of moderate-intensity, or 75 minutes (1 hour and 15 minutes) a week of vigorous-intensity aerobic physical activity, or an equivalent combination of moderate- and vigorous-intensity aerobic activity. Aerobic activity should be performed in episodes of at least 10 minutes, and preferably, it should be spread throughout  the week.  Behavioral modification strategies: increasing water intake.  Megan Hanna has agreed to follow-up with our clinic in 2 weeks. She was informed of the importance of frequent follow-up visits to maximize her success with intensive lifestyle modifications for her multiple health conditions.    Objective:   Blood pressure 118/80, pulse 74, temperature 97.9 F (36.6 C), temperature source Oral, height 5\' 7"  (1.702 m), weight 232 lb (105.2 kg), last menstrual period 04/18/2019, SpO2 94 %. Body mass index is 36.34 kg/m.  General: Cooperative, alert, well developed, in no acute distress. HEENT: Conjunctivae and lids unremarkable. Cardiovascular: Regular rhythm.  Lungs: Normal work of breathing. Neurologic: No focal deficits.   Lab Results  Component Value Date   CREATININE 0.85 01/28/2019   BUN 13 01/28/2019   NA 139 01/28/2019   K 4.7 01/28/2019   CL 102 01/28/2019   CO2 21 01/28/2019   Lab Results  Component Value Date   ALT 16 01/28/2019   AST 17 01/28/2019   ALKPHOS 83 01/28/2019   BILITOT 0.3 01/28/2019   Lab Results  Component Value Date   HGBA1C 5.7 (H) 04/28/2019   HGBA1C 5.9 (H) 01/28/2019   Lab Results  Component Value Date   INSULIN 22.7 01/28/2019   Lab Results  Component Value Date   TSH 2.550 01/28/2019   Lab Results  Component Value Date   CHOL 184 01/28/2019   HDL 46 01/28/2019   LDLCALC 123 (H) 01/28/2019   TRIG 82 01/28/2019   Lab Results  Component Value Date   WBC 5.0 01/28/2019   HGB 13.7 01/28/2019   HCT 42.6 01/28/2019   MCV 87 01/28/2019   PLT 418 01/28/2019   Lab Results  Component Value Date   IRON 51 01/28/2019   TIBC 283 01/28/2019   FERRITIN 136 01/28/2019   Attestation Statements:   Reviewed by clinician on day of visit: allergies, medications, problem list, medical history, surgical history, family history, social history, and previous encounter notes.  I, 01/30/2019, CMA, am acting as Insurance claims handler for Energy manager, DO.  I have reviewed the above documentation for accuracy and completeness, and I agree with the above. [employer], DO

## 2019-06-11 ENCOUNTER — Encounter (INDEPENDENT_AMBULATORY_CARE_PROVIDER_SITE_OTHER): Payer: Self-pay | Admitting: Family Medicine

## 2019-06-11 ENCOUNTER — Other Ambulatory Visit: Payer: Self-pay

## 2019-06-11 ENCOUNTER — Ambulatory Visit (INDEPENDENT_AMBULATORY_CARE_PROVIDER_SITE_OTHER): Payer: 59 | Admitting: Family Medicine

## 2019-06-11 VITALS — BP 119/81 | HR 80 | Temp 98.0°F | Ht 67.0 in | Wt 231.0 lb

## 2019-06-11 DIAGNOSIS — Z6836 Body mass index (BMI) 36.0-36.9, adult: Secondary | ICD-10-CM

## 2019-06-11 DIAGNOSIS — R7303 Prediabetes: Secondary | ICD-10-CM

## 2019-06-11 DIAGNOSIS — E559 Vitamin D deficiency, unspecified: Secondary | ICD-10-CM

## 2019-06-11 DIAGNOSIS — Z9189 Other specified personal risk factors, not elsewhere classified: Secondary | ICD-10-CM | POA: Diagnosis not present

## 2019-06-11 NOTE — Progress Notes (Signed)
Chief Complaint:   OBESITY Megan Hanna is here to discuss her progress with her obesity treatment plan along with follow-up of her obesity related diagnoses. Megan Hanna is on the [employer] and states she is following her eating plan approximately 80% of the time. Megan Hanna states she is walking/skating for 60 minutes 3 times per week.  Today's visit was #: 8 Starting weight: 248 lbs Starting date: 01/28/2019 Today's weight: 231 lbs Today's date: 06/11/2019 Total lbs lost to date: 17 lbs Total lbs lost since last in-office visit: 1 lb  Interim History: Megan Hanna says she is doing well, however, she is frustrated with the lack of scale movement.  She says she is happy when looking in the mirror. Today's bioimpedance results indicate that Megan Hanna has gained 2.5 pounds of water weight since her last visit.  Subjective:   1. Prediabetes Megan Hanna has a diagnosis of prediabetes based on her elevated HgA1c and was informed this puts her at greater risk of developing diabetes. She continues to work on diet and exercise to decrease her risk of diabetes. She denies nausea or hypoglycemia.  She is taking metformin 1000 mg daily.  Lab Results  Component Value Date   HGBA1C 5.7 (H) 04/28/2019   Lab Results  Component Value Date   INSULIN 22.7 01/28/2019   2. Vitamin D deficiency Megan Hanna's Vitamin D level was 29.7 on 04/28/2019. She is currently taking prescription vitamin D 50,000 IU each week. She denies nausea, vomiting or muscle weakness.  Assessment/Plan:   1. Prediabetes Megan Hanna will continue to work on weight loss, exercise, and decreasing simple carbohydrates to help decrease the risk of diabetes.   2. Vitamin D deficiency Low Vitamin D level contributes to fatigue and are associated with obesity, breast, and colon cancer. She agrees to continue to take prescription Vitamin D @50 ,000 IU every week and will follow-up for routine testing of Vitamin D, at least 2-3 times per year to avoid  over-replacement.  3. At risk for heart disease Megan Hanna was given approximately 15 minutes of coronary artery disease prevention counseling today. She is 39 y.o. female and has risk factors for heart disease including obesity. We discussed intensive lifestyle modifications today with an emphasis on specific weight loss instructions and strategies.   Repetitive spaced learning was employed today to elicit superior memory formation and behavioral change.  4. Class 2 severe obesity with serious comorbidity and body mass index (BMI) of 36.0 to 36.9 in adult, unspecified obesity type (HCC) Megan Hanna is currently in the action stage of change. As such, her goal is to continue with weight loss efforts. She has agreed to the [employer].   Exercise goals: For substantial health benefits, adults should do at least 150 minutes (2 hours and 30 minutes) a week of moderate-intensity, or 75 minutes (1 hour and 15 minutes) a week of vigorous-intensity aerobic physical activity, or an equivalent combination of moderate- and vigorous-intensity aerobic activity. Aerobic activity should be performed in episodes of at least 10 minutes, and preferably, it should be spread throughout the week. Add strength training.  Behavioral modification strategies: increasing lean protein intake.  Megan Hanna has agreed to follow-up with our clinic in 3 weeks. She was informed of the importance of frequent follow-up visits to maximize her success with intensive lifestyle modifications for her multiple health conditions.   Objective:   Blood pressure 119/81, pulse 80, temperature 98 F (36.7 C), temperature source Oral, height 5\' 7"  (1.702 m), weight 231 lb (104.8 kg), SpO2 100 %.  Body mass index is 36.18 kg/m.  General: Cooperative, alert, well developed, in no acute distress. HEENT: Conjunctivae and lids unremarkable. Cardiovascular: Regular rhythm.  Lungs: Normal work of breathing. Neurologic: No focal deficits.   Lab  Results  Component Value Date   CREATININE 0.85 01/28/2019   BUN 13 01/28/2019   NA 139 01/28/2019   K 4.7 01/28/2019   CL 102 01/28/2019   CO2 21 01/28/2019   Lab Results  Component Value Date   ALT 16 01/28/2019   AST 17 01/28/2019   ALKPHOS 83 01/28/2019   BILITOT 0.3 01/28/2019   Lab Results  Component Value Date   HGBA1C 5.7 (H) 04/28/2019   HGBA1C 5.9 (H) 01/28/2019   Lab Results  Component Value Date   INSULIN 22.7 01/28/2019   Lab Results  Component Value Date   TSH 2.550 01/28/2019   Lab Results  Component Value Date   CHOL 184 01/28/2019   HDL 46 01/28/2019   LDLCALC 123 (H) 01/28/2019   TRIG 82 01/28/2019   Lab Results  Component Value Date   WBC 5.0 01/28/2019   HGB 13.7 01/28/2019   HCT 42.6 01/28/2019   MCV 87 01/28/2019   PLT 418 01/28/2019   Lab Results  Component Value Date   IRON 51 01/28/2019   TIBC 283 01/28/2019   FERRITIN 136 01/28/2019   Attestation Statements:   Reviewed by clinician on day of visit: allergies, medications, problem list, medical history, surgical history, family history, social history, and previous encounter notes.  I, Insurance claims handler, CMA, am acting as transcriptionist for Helane Rima, DO  I have reviewed the above documentation for accuracy and completeness, and I agree with the above. Helane Rima, DO

## 2019-06-20 ENCOUNTER — Encounter (INDEPENDENT_AMBULATORY_CARE_PROVIDER_SITE_OTHER): Payer: Self-pay | Admitting: Family Medicine

## 2019-07-02 ENCOUNTER — Encounter (INDEPENDENT_AMBULATORY_CARE_PROVIDER_SITE_OTHER): Payer: Self-pay | Admitting: Family Medicine

## 2019-07-02 ENCOUNTER — Other Ambulatory Visit: Payer: Self-pay

## 2019-07-02 ENCOUNTER — Ambulatory Visit (INDEPENDENT_AMBULATORY_CARE_PROVIDER_SITE_OTHER): Payer: 59 | Admitting: Family Medicine

## 2019-07-02 VITALS — BP 115/78 | HR 87 | Temp 97.8°F | Ht 67.0 in | Wt 229.0 lb

## 2019-07-02 DIAGNOSIS — E559 Vitamin D deficiency, unspecified: Secondary | ICD-10-CM | POA: Diagnosis not present

## 2019-07-02 DIAGNOSIS — Z9189 Other specified personal risk factors, not elsewhere classified: Secondary | ICD-10-CM | POA: Diagnosis not present

## 2019-07-02 DIAGNOSIS — R7303 Prediabetes: Secondary | ICD-10-CM

## 2019-07-02 DIAGNOSIS — Z6835 Body mass index (BMI) 35.0-35.9, adult: Secondary | ICD-10-CM

## 2019-07-02 MED ORDER — PHENTERMINE HCL 37.5 MG PO TABS: 37.5000 mg | ORAL_TABLET | Freq: Every day | ORAL | 0 refills | Status: DC

## 2019-07-02 NOTE — Progress Notes (Signed)
Chief Complaint:   OBESITY Megan Hanna is here to discuss her progress with her obesity treatment plan along with follow-up of her obesity related diagnoses. Kazue is on the [employer] and states she is following her eating plan approximately 80-85% of the time. Lauriel states she is walking, doing cardio, and strength training for 30 minutes 2-3 times per week.  Today's visit was #: 9 Starting weight: 248 lbs Starting date: 01/28/2019 Today's weight: 229 lbs Today's date: 07/02/2019 Total lbs lost to date: 19 lbs Total lbs lost since last in-office visit: 2 lbs  Interim History: Maureena says she is happy with the plan, but struggling with polyphagia/cravings (see MyChart message).    Subjective:   1. Prediabetes Sera has a diagnosis of prediabetes based on her elevated HgA1c and was informed this puts her at greater risk of developing diabetes. She continues to work on diet and exercise to decrease her risk of diabetes. She denies nausea or hypoglycemia.  She is taking metformin.  Lab Results  Component Value Date   HGBA1C 5.7 (H) 04/28/2019   Lab Results  Component Value Date   INSULIN 22.7 01/28/2019   2. Vitamin D deficiency Shila's Vitamin D level was 29.7 on 04/28/2019. She is currently taking prescription vitamin D 50,000 IU each week. She denies nausea, vomiting or muscle weakness.  Assessment/Plan:   1. Prediabetes Anai will continue to work on weight loss, exercise, and decreasing simple carbohydrates to help decrease the risk of diabetes.   2. Vitamin D deficiency Low Vitamin D level contributes to fatigue and are associated with obesity, breast, and colon cancer. She agrees to continue to take prescription Vitamin D @50 ,000 IU every week and will follow-up for routine testing of Vitamin D, at least 2-3 times per year to avoid over-replacement.  3. At risk for constipation Erisa was given approximately 15 minutes of counseling today regarding  prevention of constipation. She was encouraged to increase water and fiber intake.   4. Class 2 severe obesity with serious comorbidity and body mass index (BMI) of 35.0 to 35.9 in adult, unspecified obesity type (HCC)  Orders - phentermine (ADIPEX-P) 37.5 MG tablet; Take 1 tablet (37.5 mg total) by mouth daily before breakfast.  Dispense: 30 tablet; Refill: 0  Sakinah is currently in the action stage of change. As such, her goal is to continue with weight loss efforts. She has agreed to the Colin Mulders.   Exercise goals: For substantial health benefits, adults should do at least 150 minutes (2 hours and 30 minutes) a week of moderate-intensity, or 75 minutes (1 hour and 15 minutes) a week of vigorous-intensity aerobic physical activity, or an equivalent combination of moderate- and vigorous-intensity aerobic activity. Aerobic activity should be performed in episodes of at least 10 minutes, and preferably, it should be spread throughout the week.  Behavioral modification strategies: increasing lean protein intake.  Tomika has agreed to follow-up with our clinic in 3 weeks. She was informed of the importance of frequent follow-up visits to maximize her success with intensive lifestyle modifications for her multiple health conditions.   Objective:   Blood pressure 115/78, pulse 87, temperature 97.8 F (36.6 C), temperature source Oral, height 5\' 7"  (1.702 m), weight 229 lb (103.9 kg), SpO2 100 %. Body mass index is 35.87 kg/m.  General: Cooperative, alert, well developed, in no acute distress. HEENT: Conjunctivae and lids unremarkable. Cardiovascular: Regular rhythm.  Lungs: Normal work of breathing. Neurologic: No focal deficits.   Lab Results  Component Value Date   CREATININE 0.85 01/28/2019   BUN 13 01/28/2019   NA 139 01/28/2019   K 4.7 01/28/2019   CL 102 01/28/2019   CO2 21 01/28/2019   Lab Results  Component Value Date   ALT 16 01/28/2019   AST 17 01/28/2019    ALKPHOS 83 01/28/2019   BILITOT 0.3 01/28/2019   Lab Results  Component Value Date   HGBA1C 5.7 (H) 04/28/2019   HGBA1C 5.9 (H) 01/28/2019   Lab Results  Component Value Date   INSULIN 22.7 01/28/2019   Lab Results  Component Value Date   TSH 2.550 01/28/2019   Lab Results  Component Value Date   CHOL 184 01/28/2019   HDL 46 01/28/2019   LDLCALC 123 (H) 01/28/2019   TRIG 82 01/28/2019   Lab Results  Component Value Date   WBC 5.0 01/28/2019   HGB 13.7 01/28/2019   HCT 42.6 01/28/2019   MCV 87 01/28/2019   PLT 418 01/28/2019   Lab Results  Component Value Date   IRON 51 01/28/2019   TIBC 283 01/28/2019   FERRITIN 136 01/28/2019   Attestation Statements:   Reviewed by clinician on day of visit: allergies, medications, problem list, medical history, surgical history, family history, social history, and previous encounter notes.  I, Insurance claims handler, CMA, am acting as transcriptionist for Helane Rima, DO  I have reviewed the above documentation for accuracy and completeness, and I agree with the above. Helane Rima, DO

## 2019-07-23 ENCOUNTER — Other Ambulatory Visit: Payer: Self-pay

## 2019-07-23 ENCOUNTER — Encounter (INDEPENDENT_AMBULATORY_CARE_PROVIDER_SITE_OTHER): Payer: Self-pay | Admitting: Family Medicine

## 2019-07-23 ENCOUNTER — Ambulatory Visit (INDEPENDENT_AMBULATORY_CARE_PROVIDER_SITE_OTHER): Payer: 59 | Admitting: Family Medicine

## 2019-07-23 VITALS — BP 119/82 | HR 86 | Temp 97.8°F | Ht 67.0 in | Wt 227.0 lb

## 2019-07-23 DIAGNOSIS — E559 Vitamin D deficiency, unspecified: Secondary | ICD-10-CM | POA: Diagnosis not present

## 2019-07-23 DIAGNOSIS — R7303 Prediabetes: Secondary | ICD-10-CM

## 2019-07-23 DIAGNOSIS — Z9189 Other specified personal risk factors, not elsewhere classified: Secondary | ICD-10-CM

## 2019-07-23 DIAGNOSIS — Z6835 Body mass index (BMI) 35.0-35.9, adult: Secondary | ICD-10-CM

## 2019-07-23 DIAGNOSIS — E66812 Obesity, class 2: Secondary | ICD-10-CM

## 2019-07-23 MED ORDER — VITAMIN D (ERGOCALCIFEROL) 1.25 MG (50000 UNIT) PO CAPS: 50000.0000 [IU] | ORAL_CAPSULE | ORAL | 0 refills | Status: AC

## 2019-07-23 MED ORDER — PHENTERMINE HCL 37.5 MG PO TABS: 37.5000 mg | ORAL_TABLET | Freq: Every day | ORAL | 0 refills | Status: DC

## 2019-07-23 NOTE — Progress Notes (Signed)
Chief Complaint:   OBESITY Megan Hanna is here to discuss her progress with her obesity treatment plan along with follow-up of her obesity related diagnoses. Megan Hanna is on the [employer] and states she is following her eating plan approximately 80-85% of the time. Megan Hanna states she is walking for 30 minutes 2 times per week and boxing for 45 minutes 2 times per week.  Today's visit was #: 10 Starting weight: 248 lbs Starting date: 01/28/2019 Today's weight: 227 lbs Today's date: 07/23/2019 Total lbs lost to date: 21 lbs Total lbs lost since last in-office visit: 2 lbs  Interim History: Megan Hanna started taking 1/2 dose phentermine 2 weeks ago.  Since then, she has had increased thirst - so drinking more water, decreased cravings, increased energy, and increased focus.  She denies insomnia.  No constipation.    Subjective:   1. Vitamin D deficiency Megan Hanna's Vitamin D level was 29.7 on 04/28/2019. She is currently taking prescription vitamin D 50,000 IU each week. She denies nausea, vomiting or muscle weakness.  2. Prediabetes Megan Hanna has a diagnosis of prediabetes based on her elevated HgA1c and was informed this puts her at greater risk of developing diabetes. She continues to work on diet and exercise to decrease her risk of diabetes. She denies nausea or hypoglycemia.  She is taking metformin 1000 mg daily.  A1c has decreased from 5.9 to 5.7.  Lab Results  Component Value Date   HGBA1C 5.7 (H) 04/28/2019   Lab Results  Component Value Date   INSULIN 22.7 01/28/2019   3. At risk for deficient intake of food The patient is at a higher than average risk of deficient intake of food due to taking phentermine.  Assessment/Plan:   1. Vitamin D deficiency Low Vitamin D level contributes to fatigue and are associated with obesity, breast, and colon cancer. She agrees to continue to take prescription Vitamin D @50 ,000 IU every week and will follow-up for routine testing of Vitamin  D, at least 2-3 times per year to avoid over-replacement.  - Vitamin D, Ergocalciferol, (DRISDOL) 1.25 MG (50000 UNIT) CAPS capsule; Take 1 capsule (50,000 Units total) by mouth every 7 (seven) days.  Dispense: 4 capsule; Refill: 0  2. Prediabetes Megan Hanna will continue to work on weight loss, exercise, and decreasing simple carbohydrates to help decrease the risk of diabetes.   3. At risk for deficient intake of food Megan Hanna was given approximately 15 minutes of deficit intake of food prevention counseling today. Megan Hanna is at risk for eating too few calories based on current food recall. She was encouraged to focus on meeting caloric and protein goals according to her recommended meal plan.   4. Class 2 severe obesity with serious comorbidity and body mass index (BMI) of 35.0 to 35.9 in adult, unspecified obesity type (HCC)  - phentermine (ADIPEX-P) 37.5 MG tablet; Take 1 tablet (37.5 mg total) by mouth daily before breakfast.  Dispense: 30 tablet; Refill: 0  She understands that use of phentermine after 3 months is considered off-label.  Megan Hanna is currently in the action stage of change. As such, her goal is to continue with weight loss efforts. She has agreed to the Megan Hanna.   Exercise goals: For substantial health benefits, adults should do at least 150 minutes (2 hours and 30 minutes) a week of moderate-intensity, or 75 minutes (1 hour and 15 minutes) a week of vigorous-intensity aerobic physical activity, or an equivalent combination of moderate- and vigorous-intensity aerobic activity. Aerobic activity should  be performed in episodes of at least 10 minutes, and preferably, it should be spread throughout the week.  Behavioral modification strategies: increasing lean protein intake, decreasing simple carbohydrates, increasing vegetables and increasing water intake.  Megan Hanna has agreed to follow-up with our clinic in 3 weeks. She was informed of the importance of frequent  follow-up visits to maximize her success with intensive lifestyle modifications for her multiple health conditions.   Objective:   Blood pressure 119/82, pulse 86, temperature 97.8 F (36.6 C), temperature source Oral, height 5\' 7"  (1.702 m), weight 227 lb (103 kg), SpO2 99 %. Body mass index is 35.55 kg/m.  General: Cooperative, alert, well developed, in no acute distress. HEENT: Conjunctivae and lids unremarkable. Cardiovascular: Regular rhythm.  Lungs: Normal work of breathing. Neurologic: No focal deficits.   Lab Results  Component Value Date   CREATININE 0.85 01/28/2019   BUN 13 01/28/2019   NA 139 01/28/2019   K 4.7 01/28/2019   CL 102 01/28/2019   CO2 21 01/28/2019   Lab Results  Component Value Date   ALT 16 01/28/2019   AST 17 01/28/2019   ALKPHOS 83 01/28/2019   BILITOT 0.3 01/28/2019   Lab Results  Component Value Date   HGBA1C 5.7 (H) 04/28/2019   HGBA1C 5.9 (H) 01/28/2019   Lab Results  Component Value Date   INSULIN 22.7 01/28/2019   Lab Results  Component Value Date   TSH 2.550 01/28/2019   Lab Results  Component Value Date   CHOL 184 01/28/2019   HDL 46 01/28/2019   LDLCALC 123 (H) 01/28/2019   TRIG 82 01/28/2019   Lab Results  Component Value Date   WBC 5.0 01/28/2019   HGB 13.7 01/28/2019   HCT 42.6 01/28/2019   MCV 87 01/28/2019   PLT 418 01/28/2019   Lab Results  Component Value Date   IRON 51 01/28/2019   TIBC 283 01/28/2019   FERRITIN 136 01/28/2019   Attestation Statements:   Reviewed by clinician on day of visit: allergies, medications, problem list, medical history, surgical history, family history, social history, and previous encounter notes.  I, 01/30/2019, CMA, am acting as transcriptionist for Insurance claims handler, DO  I have reviewed the above documentation for accuracy and completeness, and I agree with the above. Helane Rima, DO

## 2019-08-13 ENCOUNTER — Other Ambulatory Visit: Payer: Self-pay

## 2019-08-13 ENCOUNTER — Ambulatory Visit (INDEPENDENT_AMBULATORY_CARE_PROVIDER_SITE_OTHER): Payer: 59 | Admitting: Family Medicine

## 2019-08-13 ENCOUNTER — Encounter (INDEPENDENT_AMBULATORY_CARE_PROVIDER_SITE_OTHER): Payer: Self-pay | Admitting: Family Medicine

## 2019-08-13 VITALS — BP 108/78 | HR 101 | Temp 98.0°F | Ht 67.0 in | Wt 222.0 lb

## 2019-08-13 DIAGNOSIS — E669 Obesity, unspecified: Secondary | ICD-10-CM

## 2019-08-13 DIAGNOSIS — E559 Vitamin D deficiency, unspecified: Secondary | ICD-10-CM | POA: Diagnosis not present

## 2019-08-13 DIAGNOSIS — Z9189 Other specified personal risk factors, not elsewhere classified: Secondary | ICD-10-CM | POA: Diagnosis not present

## 2019-08-13 DIAGNOSIS — R7303 Prediabetes: Secondary | ICD-10-CM

## 2019-08-13 DIAGNOSIS — Z6834 Body mass index (BMI) 34.0-34.9, adult: Secondary | ICD-10-CM

## 2019-08-13 NOTE — Progress Notes (Signed)
Chief Complaint:   OBESITY Megan Hanna is here to discuss her progress with her obesity treatment plan along with follow-up of her obesity related diagnoses. Megan Hanna is on the [employer] and states she is following her eating plan approximately 60% of the time. Megan Hanna states she is walking and swimming for 30 minutes 2 times per week.  Today's visit was #: 11 Starting weight: 248 lbs Starting date: 01/28/2019 Today's weight: 222 lbs Today's date: 08/13/2019 Total lbs lost to date: 26 lbs Total lbs lost since last in-office visit: 5 lbs Total weight loss percentage to date: -10.48%  Interim History: Megan Hanna says that her appetite is controlled.  She has increased the amount of swimming she has been doing.  Subjective:   1. Prediabetes  Lab Results  Component Value Date   HGBA1C 5.7 (H) 04/28/2019   Lab Results  Component Value Date   INSULIN 22.7 01/28/2019   2. Vitamin D deficiency Megan Hanna Vitamin D level was 29.7 on 04/28/2019. She is currently taking prescription vitamin D 50,000 IU each week.   Assessment/Plan:   1. Prediabetes Goal is HgbA1c < 5.7 and insulin level closer to 5. Megan Hanna will continue to work on weight loss, exercise, and decreasing simple carbohydrates to help decrease the risk of diabetes.   2. Vitamin D deficiency Not at goal. Optimal goal > 50 ng/dL. There is also evidence to support a goal of >70 ng/dL in patients with cancer and heart disease. Plan: Continue Vitamin D @50 ,000 IU every week with follow-up for routine testing of Vitamin D at least 2-3 times per year to avoid over-replacement.  3. At risk for deficient intake of food Megan Hanna was given approximately 15 minutes of deficit intake of food prevention counseling today. Megan Hanna is at risk for eating too few calories based on current food recall. She was encouraged to focus on meeting caloric and protein goals according to her recommended meal plan.   4. Class 1 obesity with serious  comorbidity and body mass index (BMI) of 34.0 to 34.9 in adult, unspecified obesity type Megan Hanna is currently in the action stage of change. As such, her goal is to continue with weight loss efforts. She has agreed to the Megan Hanna.   Exercise goals: For substantial health benefits, adults should do at least 150 minutes (2 hours and 30 minutes) a week of moderate-intensity, or 75 minutes (1 hour and 15 minutes) a week of vigorous-intensity aerobic physical activity, or an equivalent combination of moderate- and vigorous-intensity aerobic activity. Aerobic activity should be performed in episodes of at least 10 minutes, and preferably, it should be spread throughout the week.  Behavioral modification strategies: increasing lean protein intake.  Megan Hanna has agreed to follow-up with our clinic in 3 weeks. She was informed of the importance of frequent follow-up visits to maximize her success with intensive lifestyle modifications for her multiple health conditions.   Objective:   Blood pressure 108/78, pulse (!) 101, temperature 98 F (36.7 C), temperature source Oral, height 5\' 7"  (1.702 m), weight 222 lb (100.7 kg), SpO2 100 %. Body mass index is 34.77 kg/m.  General: Cooperative, alert, well developed, in no acute distress. HEENT: Conjunctivae and lids unremarkable. Cardiovascular: Regular rhythm.  Lungs: Normal work of breathing. Neurologic: No focal deficits.   Lab Results  Component Value Date   CREATININE 0.85 01/28/2019   BUN 13 01/28/2019   NA 139 01/28/2019   K 4.7 01/28/2019   CL 102 01/28/2019   CO2 21 01/28/2019  Lab Results  Component Value Date   ALT 16 01/28/2019   AST 17 01/28/2019   ALKPHOS 83 01/28/2019   BILITOT 0.3 01/28/2019   Lab Results  Component Value Date   HGBA1C 5.7 (H) 04/28/2019   HGBA1C 5.9 (H) 01/28/2019   Lab Results  Component Value Date   INSULIN 22.7 01/28/2019   Lab Results  Component Value Date   TSH 2.550 01/28/2019   Lab  Results  Component Value Date   CHOL 184 01/28/2019   HDL 46 01/28/2019   LDLCALC 123 (H) 01/28/2019   TRIG 82 01/28/2019   Lab Results  Component Value Date   WBC 5.0 01/28/2019   HGB 13.7 01/28/2019   HCT 42.6 01/28/2019   MCV 87 01/28/2019   PLT 418 01/28/2019   Lab Results  Component Value Date   IRON 51 01/28/2019   TIBC 283 01/28/2019   FERRITIN 136 01/28/2019   Attestation Statements:   Reviewed by clinician on day of visit: allergies, medications, problem list, medical history, surgical history, family history, social history, and previous encounter notes.  I, Insurance claims handler, CMA, am acting as transcriptionist for Helane Rima, DO  I have reviewed the above documentation for accuracy and completeness, and I agree with the above. Helane Rima, DO

## 2019-09-09 ENCOUNTER — Encounter (INDEPENDENT_AMBULATORY_CARE_PROVIDER_SITE_OTHER): Payer: Self-pay | Admitting: Family Medicine

## 2019-09-09 ENCOUNTER — Ambulatory Visit (INDEPENDENT_AMBULATORY_CARE_PROVIDER_SITE_OTHER): Payer: 59 | Admitting: Family Medicine

## 2019-09-09 ENCOUNTER — Other Ambulatory Visit: Payer: Self-pay

## 2019-09-09 VITALS — BP 110/76 | HR 85 | Temp 98.0°F | Ht 67.0 in | Wt 221.0 lb

## 2019-09-09 DIAGNOSIS — E669 Obesity, unspecified: Secondary | ICD-10-CM | POA: Diagnosis not present

## 2019-09-09 DIAGNOSIS — Z9189 Other specified personal risk factors, not elsewhere classified: Secondary | ICD-10-CM | POA: Diagnosis not present

## 2019-09-09 DIAGNOSIS — R7303 Prediabetes: Secondary | ICD-10-CM | POA: Diagnosis not present

## 2019-09-09 DIAGNOSIS — Z6834 Body mass index (BMI) 34.0-34.9, adult: Secondary | ICD-10-CM | POA: Diagnosis not present

## 2019-09-09 DIAGNOSIS — E559 Vitamin D deficiency, unspecified: Secondary | ICD-10-CM | POA: Diagnosis not present

## 2019-09-09 MED ORDER — SAXENDA 18 MG/3ML ~~LOC~~ SOPN: 3.0000 mg | PEN_INJECTOR | Freq: Every day | SUBCUTANEOUS | 0 refills | Status: DC

## 2019-09-09 MED ORDER — INSULIN PEN NEEDLE 32G X 4 MM MISC: 1.0000 | Freq: Every day | 0 refills | Status: DC

## 2019-09-09 NOTE — Progress Notes (Signed)
Chief Complaint:   OBESITY Elcie is here to discuss her progress with her obesity treatment plan along with follow-up of her obesity related diagnoses. Todd is on the [employer] and states she is following her eating plan approximately 75% of the time. Nicki states she is walking and/or boxing for 30 minutes 2 times per week.  Today's visit was #: 12 Starting weight: 248 lbs Starting date: 01/28/2019 Today's weight: 221 lbs Today's date: 09/09/2019 Total lbs lost to date: 27 lbs Total lbs lost since last in-office visit: 1 lb Total weight loss percentage to date: -10.89  Interim History: Grayson says she is bored with eating.  She is not satisfied, even after eating.  She reports that her water intake is not great.  Assessment/Plan:   1. Prediabetes Goal is HgbA1c < 5.7 and insulin level closer to 5. The current medical regimen is effective;  continue present plan and medications.  Lab Results  Component Value Date   HGBA1C 5.7 (H) 04/28/2019   Lab Results  Component Value Date   INSULIN 22.7 01/28/2019   2. Vitamin D deficiency Current vitamin D is 29.7, tested on 04/28/2019. Not at goal. Optimal goal > 50 ng/dL. There is also evidence to support a goal of >70 ng/dL in patients with cancer and heart disease. Plan: Continue Vitamin D @50 ,000 IU every week with follow-up for routine testing of Vitamin D at least 2-3 times per year to avoid over-replacement.  3. At risk for nausea Nicholle is at risk for nausea due to taking Saxenda.  Erinne S Obyrne was given approximately 15 minutes of nausea prevention counseling today. Cyril is at risk for nausea due to her new or current medication. She was encouraged to titrate her medication slowly, make sure to stay hydrated, eat smaller portions throughout the day, and avoid high fat meals.   4. Class 1 obesity with serious comorbidity and body mass index (BMI) of 34.0 to 34.9 in adult, unspecified obesity type We have  reviewed the risks and benefits of Saxenda. The patient denies a personal or family history of medullary thyroid cancer or MENII. The patient denies a history of pancreatitis.  Alternative treatment options have been discussed. Patient understands that all anti-obesity medications are contraindicated in pregnancy. The potential risks and benefits of Saxenda were reviewed with the patient, and alternative treatment options were discussed. All questions were answered, and the patient wishes to move forward with this medication.  Please visit [email or web address] for information about this medication. Please visit [email or web address] to review how to administer Saxenda.   Start with escalation dose: ? Week 1: 0.6 mg [place] once daily x 1 week ? Week 2: 1.2 mg [place] once daily x 1 week ? Week 3: 1.8 mg [place] once daily x 1 week ? Week 4: 2.4 mg [place] once daily x 1 week ? Week 5 onward: 3 mg [place] once daily  - Liraglutide -Weight Management (SAXENDA) 18 MG/3ML SOPN; Inject 0.5 mLs (3 mg total) into the skin daily.  Dispense: 15 mL; Refill: 0 - start with 0.6 - Insulin Pen Needle 32G X 4 MM MISC; 1 each by Does not apply route daily.  Dispense: 100 each; Refill: 0  Emonnie is currently in the action stage of change. As such, her goal is to continue with weight loss efforts. She has agreed to keeping a food journal and adhering to recommended goals of 1200 calories and 95+ grams of protein.   Exercise goals: For substantial health  benefits, adults should do at least 150 minutes (2 hours and 30 minutes) a week of moderate-intensity, or 75 minutes (1 hour and 15 minutes) a week of vigorous-intensity aerobic physical activity, or an equivalent combination of moderate- and vigorous-intensity aerobic activity. Aerobic activity should be performed in episodes of at least 10 minutes, and preferably, it should be spread throughout the week.  Behavioral modification strategies: increasing water intake.  Focus on 20-30 grams of  protein at each meal.  Grant has agreed to follow-up with our clinic in 3-4 weeks. She was informed of the importance of frequent follow-up visits to maximize her success with intensive lifestyle modifications for her multiple health conditions.   Objective:   Blood pressure 110/76, pulse 85, temperature 98 F (36.7 C), temperature source Oral, height 5\' 7"  (1.702 m), weight 221 lb (100.2 kg), SpO2 100 %. Body mass index is 34.61 kg/m.  General: Cooperative, alert, well developed, in no acute distress. HEENT: Conjunctivae and lids unremarkable. Cardiovascular: Regular rhythm.  Lungs: Normal work of breathing. Neurologic: No focal deficits.   Lab Results  Component Value Date   CREATININE 0.85 01/28/2019   BUN 13 01/28/2019   NA 139 01/28/2019   K 4.7 01/28/2019   CL 102 01/28/2019   CO2 21 01/28/2019   Lab Results  Component Value Date   ALT 16 01/28/2019   AST 17 01/28/2019   ALKPHOS 83 01/28/2019   BILITOT 0.3 01/28/2019   Lab Results  Component Value Date   HGBA1C 5.7 (H) 04/28/2019   HGBA1C 5.9 (H) 01/28/2019   Lab Results  Component Value Date   INSULIN 22.7 01/28/2019   Lab Results  Component Value Date   TSH 2.550 01/28/2019   Lab Results  Component Value Date   CHOL 184 01/28/2019   HDL 46 01/28/2019   LDLCALC 123 (H) 01/28/2019   TRIG 82 01/28/2019   Lab Results  Component Value Date   WBC 5.0 01/28/2019   HGB 13.7 01/28/2019   HCT 42.6 01/28/2019   MCV 87 01/28/2019   PLT 418 01/28/2019   Lab Results  Component Value Date   IRON 51 01/28/2019   TIBC 283 01/28/2019   FERRITIN 136 01/28/2019   Attestation Statements:   Reviewed by clinician on day of visit: allergies, medications, problem list, medical history, surgical history, family history, social history, and previous encounter notes.  I, 01/30/2019, CMA, am acting as transcriptionist for Insurance claims handler, DO  I have reviewed the above documentation for accuracy and completeness,  and I agree with the above. Helane Rima, DO

## 2019-09-10 ENCOUNTER — Encounter (INDEPENDENT_AMBULATORY_CARE_PROVIDER_SITE_OTHER): Payer: Self-pay | Admitting: Family Medicine

## 2019-09-10 NOTE — Telephone Encounter (Signed)
Attempted PA and medication is a plan exclusion

## 2019-09-15 MED ORDER — PHENTERMINE HCL 37.5 MG PO TABS: 37.5000 mg | ORAL_TABLET | Freq: Every day | ORAL | 0 refills | Status: DC

## 2019-09-30 ENCOUNTER — Other Ambulatory Visit: Payer: Self-pay

## 2019-09-30 ENCOUNTER — Ambulatory Visit (INDEPENDENT_AMBULATORY_CARE_PROVIDER_SITE_OTHER): Payer: 59 | Admitting: Family Medicine

## 2019-09-30 ENCOUNTER — Encounter (INDEPENDENT_AMBULATORY_CARE_PROVIDER_SITE_OTHER): Payer: Self-pay | Admitting: Family Medicine

## 2019-09-30 VITALS — BP 132/84 | HR 91 | Temp 98.1°F | Ht 67.0 in | Wt 224.0 lb

## 2019-09-30 DIAGNOSIS — E8881 Metabolic syndrome: Secondary | ICD-10-CM | POA: Diagnosis not present

## 2019-09-30 DIAGNOSIS — R632 Polyphagia: Secondary | ICD-10-CM | POA: Diagnosis not present

## 2019-09-30 DIAGNOSIS — R7303 Prediabetes: Secondary | ICD-10-CM | POA: Diagnosis not present

## 2019-09-30 DIAGNOSIS — Z6835 Body mass index (BMI) 35.0-35.9, adult: Secondary | ICD-10-CM

## 2019-09-30 MED ORDER — METFORMIN HCL 1000 MG PO TABS: 1000.0000 mg | ORAL_TABLET | Freq: Every day | ORAL | 0 refills | Status: DC

## 2019-09-30 MED ORDER — PHENTERMINE HCL 37.5 MG PO TABS: 37.5000 mg | ORAL_TABLET | Freq: Every day | ORAL | 0 refills | Status: DC

## 2019-09-30 MED ORDER — OZEMPIC (0.25 OR 0.5 MG/DOSE) 2 MG/1.5ML ~~LOC~~ SOPN: 0.2500 mg | PEN_INJECTOR | SUBCUTANEOUS | 0 refills | Status: DC

## 2019-09-30 NOTE — Progress Notes (Signed)
Chief Complaint:   OBESITY Megan Hanna is here to discuss her progress with her obesity treatment plan along with follow-up of her obesity related diagnoses. Megan Hanna is on keeping a food journal and adhering to recommended goals of 1200 calories and 95+ grams of protein and states she is following her eating plan approximately 75% of the time. Megan Hanna states she is walking/boxing for 45 minutes 1 times per week.  Today's visit was #: 13 Starting weight: 248 lbs Starting date: 01/28/2019 Today's weight: 224 lbs Today's date: 09/29/2019 Total lbs lost to date: 24 lbs Total lbs lost since last in-office visit: 0 Total weight loss percentage to date: -9.68%  Interim History: Megan Hanna says that Megan Hanna is excluded by her insurance company.  She reports that work has been very chaotic lately.  She has been getting less sleep. Plan:  Increase water intake and increase exercise. Today's bioimpedance results indicate that Aza has gained 3 pounds of water weight since her last visit.  Assessment/Plan:   1. Polyphagia Improving with phentermine use. Working well without side effects. Will continue at current dose. Hyperphagia, also called polyphagia, refers to excessive feelings of hunger, which are not relieved by eating. This is more likely to be an issues for people that have diabetes, prediabetes, or insulin resistance.   Patient understands that long-term use of phentermine is considered off-label use of this medication, however, that the Endocrine Society and recent research supports that long-term use of phentermine does not appear to have detrimental health effects when used in the appropriate patient. In addition, a 2019 study published in Obesity Journal on [id number] patients concluded that "recommendations to limit phentermine to less than 3 months do not align with current concepts of pharmacologic treatment of obesity", and that "long term phentermine users experience greater weight loss  without apparent increases in cardiovascular risk". The potential risks and benefits of phentermine have been reviewed with the patient, and alternative treatment options were discussed. All questions were answered, and the patient wishes to move forward with this medication.  I have consulted the [place] Controlled Substances Registry for this patient, and feel the risk/benefit ratio today is favorable for proceeding with this prescription for a controlled substance. The patient understands monitoring parameters and red flags.   - phentermine (ADIPEX-P) 37.5 MG tablet; Take 1 tablet (37.5 mg total) by mouth daily before breakfast.  Dispense: 30 tablet; Refill: 0  2. Prediabetes Not at goal. Goal is HgbA1c < 5.7 and insulin level closer to 5. She will continue to focus on protein-rich, low simple carbohydrate foods. We reviewed the importance of hydration, regular exercise for stress reduction, and restorative sleep.   Lab Results  Component Value Date   HGBA1C 5.7 (H) 04/28/2019   Lab Results  Component Value Date   INSULIN 22.7 01/28/2019   - metFORMIN (GLUCOPHAGE) 1000 MG tablet; Take 1 tablet (1,000 mg total) by mouth daily at 2 PM.  Dispense: 60 tablet; Refill: 0  3. Metabolic syndrome Goal: Lose 7-10% of starting weight. We discussed several lifestyle modifications today and she will continue to work on diet, exercise and weight loss efforts.   - Semaglutide,0.25 or 0.5MG /DOS, (OZEMPIC, 0.25 OR 0.5 MG/DOSE,) 2 MG/1.5ML SOPN; Inject 0.25 mg into the skin once a week.  Dispense: 0.75 mL; Refill: 0  4. Class 2 severe obesity with serious comorbidity and body mass index (BMI) of 35.0 to 35.9 in adult, unspecified obesity type (HCC)  Megan Hanna is currently in the action stage of change.  As such, her goal is to continue with weight loss efforts. She has agreed to keeping a food journal and adhering to recommended goals of 1200 calories and 95+ grams of protein.   Exercise goals: For substantial  health benefits, adults should do at least 150 minutes (2 hours and 30 minutes) a week of moderate-intensity, or 75 minutes (1 hour and 15 minutes) a week of vigorous-intensity aerobic physical activity, or an equivalent combination of moderate- and vigorous-intensity aerobic activity. Aerobic activity should be performed in episodes of at least 10 minutes, and preferably, it should be spread throughout the week.  Behavioral modification strategies: increasing water intake.  Megan Hanna has agreed to follow-up with our clinic in 3-4 weeks. She was informed of the importance of frequent follow-up visits to maximize her success with intensive lifestyle modifications for her multiple health conditions.   Objective:   Blood pressure 132/84, pulse 91, temperature 98.1 F (36.7 C), temperature source Oral, height 5\' 7"  (1.702 m), weight 224 lb (101.6 kg), SpO2 100 %. Body mass index is 35.08 kg/m.  General: Cooperative, alert, well developed, in no acute distress. HEENT: Conjunctivae and lids unremarkable. Cardiovascular: Regular rhythm.  Lungs: Normal work of breathing. Neurologic: No focal deficits.   Lab Results  Component Value Date   CREATININE 0.85 01/28/2019   BUN 13 01/28/2019   NA 139 01/28/2019   K 4.7 01/28/2019   CL 102 01/28/2019   CO2 21 01/28/2019   Lab Results  Component Value Date   ALT 16 01/28/2019   AST 17 01/28/2019   ALKPHOS 83 01/28/2019   BILITOT 0.3 01/28/2019   Lab Results  Component Value Date   HGBA1C 5.7 (H) 04/28/2019   HGBA1C 5.9 (H) 01/28/2019   Lab Results  Component Value Date   INSULIN 22.7 01/28/2019   Lab Results  Component Value Date   TSH 2.550 01/28/2019   Lab Results  Component Value Date   CHOL 184 01/28/2019   HDL 46 01/28/2019   LDLCALC 123 (H) 01/28/2019   TRIG 82 01/28/2019   Lab Results  Component Value Date   WBC 5.0 01/28/2019   HGB 13.7 01/28/2019   HCT 42.6 01/28/2019   MCV 87 01/28/2019   PLT 418 01/28/2019   Lab  Results  Component Value Date   IRON 51 01/28/2019   TIBC 283 01/28/2019   FERRITIN 136 01/28/2019   Attestation Statements:   Reviewed by clinician on day of visit: allergies, medications, problem list, medical history, surgical history, family history, social history, and previous encounter notes.  I, 01/30/2019, CMA, am acting as transcriptionist for Insurance claims handler, DO  I have reviewed the above documentation for accuracy and completeness, and I agree with the above. Helane Rima, DO

## 2019-10-06 ENCOUNTER — Telehealth (INDEPENDENT_AMBULATORY_CARE_PROVIDER_SITE_OTHER): Payer: Self-pay

## 2019-10-06 NOTE — Telephone Encounter (Signed)
Ozempic PA denied due to patient not having DX for DM type 2

## 2019-10-14 ENCOUNTER — Encounter (INDEPENDENT_AMBULATORY_CARE_PROVIDER_SITE_OTHER): Payer: Self-pay | Admitting: Family Medicine

## 2019-10-14 ENCOUNTER — Ambulatory Visit (INDEPENDENT_AMBULATORY_CARE_PROVIDER_SITE_OTHER): Payer: 59 | Admitting: Family Medicine

## 2019-10-14 ENCOUNTER — Other Ambulatory Visit: Payer: Self-pay

## 2019-10-14 VITALS — BP 110/77 | HR 91 | Temp 97.9°F | Ht 67.0 in | Wt 218.0 lb

## 2019-10-14 DIAGNOSIS — E669 Obesity, unspecified: Secondary | ICD-10-CM | POA: Diagnosis not present

## 2019-10-14 DIAGNOSIS — Z6834 Body mass index (BMI) 34.0-34.9, adult: Secondary | ICD-10-CM

## 2019-10-14 DIAGNOSIS — E559 Vitamin D deficiency, unspecified: Secondary | ICD-10-CM | POA: Diagnosis not present

## 2019-10-14 DIAGNOSIS — R632 Polyphagia: Secondary | ICD-10-CM

## 2019-10-14 DIAGNOSIS — R7303 Prediabetes: Secondary | ICD-10-CM

## 2019-10-15 NOTE — Progress Notes (Signed)
Chief Complaint:   OBESITY Megan Hanna is here to discuss her progress with her obesity treatment plan along with follow-up of her obesity related diagnoses. Megan Hanna is on the [employer] and states she is following her eating plan approximately 85% of the time. Megan Hanna states she is walking/boxing for 45 minutes 3 times per week.  Today's visit was #: 14 Starting weight: 248 lbs Starting date: 01/28/2019 Today's weight: 218 lbs Today's date: 10/14/2019 Total lbs lost to date: 30 lbs Total lbs lost since last in-office visit: 6 lbs Total weight loss percentage to date: -12.10%  Interim History: Megan Hanna says she is trying to get in more water.  She has increased her sleep and is now getting 6-7 hours a night.  She says she is making better choices regarding food.  Assessment/Plan:   1. Polyphagia Hyperphagia, also called polyphagia, refers to excessive feelings of hunger. This is more likely to be an issues for people that have diabetes, prediabetes, or insulin resistance. She will continue to focus on protein-rich, low simple carbohydrate foods. We reviewed the importance of hydration, regular exercise for stress reduction, and restorative sleep.  - Refill phentermine (ADIPEX-P) 37.5 MG tablet; Take 1 tablet (37.5 mg total) by mouth daily before breakfast.  Dispense: 30 tablet; Refill: 0  Patient understands that long-term use of phentermine is considered off-label use of this medication, however, that the Endocrine Society and recent research supports that long-term use of phentermine does not appear to have detrimental health effects when used in the appropriate patient. In addition, a 2019 study published in Obesity Journal on [id number] patients concluded that "recommendations to limit phentermine to less than 3 months do not align with current concepts of pharmacologic treatment of obesity", and that "long term phentermine users experience greater weight loss without apparent  increases in cardiovascular risk".  The potential risks and benefits of phentermine have been reviewed with the patient, and alternative treatment options were discussed. All questions were answered, and the patient wishes to move forward with this medication.  I have consulted the [place] Controlled Substances Registry for this patient, and feel the risk/benefit ratio today is favorable for proceeding with this prescription for a controlled substance. The patient understands monitoring parameters and red flags.   2. Vitamin D deficiency Current vitamin D is 29.7, tested on 04/28/2019. Not at goal. Optimal goal > 50 ng/dL.   Plan: Continue Vitamin D @50 ,000 IU every week with follow-up for routine testing of Vitamin D at least 2-3 times per year to avoid over-replacement.  3. Prediabetes Not optimized. Goal is HgbA1c < 5.7 and insulin level closer to 5.  She will continue to focus on protein-rich, low simple carbohydrate foods. We reviewed the importance of hydration, regular exercise for stress reduction, and restorative sleep.   Lab Results  Component Value Date   HGBA1C 5.7 (H) 04/28/2019   Lab Results  Component Value Date   INSULIN 22.7 01/28/2019   4. Class 1 obesity with serious comorbidity and body mass index (BMI) of 34.0 to 34.9 in adult, unspecified obesity type  Megan Hanna is currently in the action stage of change. As such, her goal is to continue with weight loss efforts. She has agreed to the Megan Hanna.   Exercise goals: For substantial health benefits, adults should do at least 150 minutes (2 hours and 30 minutes) a week of moderate-intensity, or 75 minutes (1 hour and 15 minutes) a week of vigorous-intensity aerobic physical activity, or an equivalent combination of  moderate- and vigorous-intensity aerobic activity. Aerobic activity should be performed in episodes of at least 10 minutes, and preferably, it should be spread throughout the week.  Behavioral modification  strategies: increasing lean protein intake, decreasing simple carbohydrates, increasing vegetables and increasing water intake.  Megan Hanna has agreed to follow-up with our clinic in 3 weeks. She was informed of the importance of frequent follow-up visits to maximize her success with intensive lifestyle modifications for her multiple health conditions.   Objective:   Blood pressure 110/77, pulse 91, temperature 97.9 F (36.6 C), temperature source Oral, height 5\' 7"  (1.702 m), weight 218 lb (98.9 kg). Body mass index is 34.14 kg/m.  General: Cooperative, alert, well developed, in no acute distress. HEENT: Conjunctivae and lids unremarkable. Cardiovascular: Regular rhythm.  Lungs: Normal work of breathing. Neurologic: No focal deficits.   Lab Results  Component Value Date   CREATININE 0.85 01/28/2019   BUN 13 01/28/2019   NA 139 01/28/2019   K 4.7 01/28/2019   CL 102 01/28/2019   CO2 21 01/28/2019   Lab Results  Component Value Date   ALT 16 01/28/2019   AST 17 01/28/2019   ALKPHOS 83 01/28/2019   BILITOT 0.3 01/28/2019   Lab Results  Component Value Date   HGBA1C 5.7 (H) 04/28/2019   HGBA1C 5.9 (H) 01/28/2019   Lab Results  Component Value Date   INSULIN 22.7 01/28/2019   Lab Results  Component Value Date   TSH 2.550 01/28/2019   Lab Results  Component Value Date   CHOL 184 01/28/2019   HDL 46 01/28/2019   LDLCALC 123 (H) 01/28/2019   TRIG 82 01/28/2019   Lab Results  Component Value Date   WBC 5.0 01/28/2019   HGB 13.7 01/28/2019   HCT 42.6 01/28/2019   MCV 87 01/28/2019   PLT 418 01/28/2019   Lab Results  Component Value Date   IRON 51 01/28/2019   TIBC 283 01/28/2019   FERRITIN 136 01/28/2019   Attestation Statements:   Reviewed by clinician on day of visit: allergies, medications, problem list, medical history, surgical history, family history, social history, and previous encounter notes.  I, 01/30/2019, CMA, am acting as transcriptionist for  Insurance claims handler, DO  I have reviewed the above documentation for accuracy and completeness, and I agree with the above. Helane Rima, DO

## 2019-10-16 MED ORDER — PHENTERMINE HCL 37.5 MG PO TABS: 37.5000 mg | ORAL_TABLET | Freq: Every day | ORAL | 0 refills | Status: DC

## 2019-11-18 ENCOUNTER — Encounter (INDEPENDENT_AMBULATORY_CARE_PROVIDER_SITE_OTHER): Payer: Self-pay | Admitting: Family Medicine

## 2019-11-18 ENCOUNTER — Other Ambulatory Visit: Payer: Self-pay

## 2019-11-18 ENCOUNTER — Ambulatory Visit (INDEPENDENT_AMBULATORY_CARE_PROVIDER_SITE_OTHER): Payer: 59 | Admitting: Family Medicine

## 2019-11-18 VITALS — BP 135/85 | HR 68 | Temp 98.0°F | Ht 67.0 in | Wt 231.0 lb

## 2019-11-18 DIAGNOSIS — R632 Polyphagia: Secondary | ICD-10-CM | POA: Diagnosis not present

## 2019-11-18 DIAGNOSIS — Z6836 Body mass index (BMI) 36.0-36.9, adult: Secondary | ICD-10-CM

## 2019-11-18 DIAGNOSIS — Z8742 Personal history of other diseases of the female genital tract: Secondary | ICD-10-CM | POA: Diagnosis not present

## 2019-11-18 DIAGNOSIS — R7303 Prediabetes: Secondary | ICD-10-CM | POA: Diagnosis not present

## 2019-11-18 MED ORDER — PHENTERMINE HCL 37.5 MG PO TABS
37.5000 mg | ORAL_TABLET | Freq: Every day | ORAL | 0 refills | Status: DC
Start: 2019-11-18 — End: 2019-12-09

## 2019-11-18 MED ORDER — METFORMIN HCL 850 MG PO TABS: 850.0000 mg | ORAL_TABLET | Freq: Two times a day (BID) | ORAL | 0 refills | Status: DC

## 2019-11-20 ENCOUNTER — Encounter (INDEPENDENT_AMBULATORY_CARE_PROVIDER_SITE_OTHER): Payer: Self-pay | Admitting: Family Medicine

## 2019-11-20 NOTE — Progress Notes (Signed)
Chief Complaint:   OBESITY Megan Hanna is here to discuss her progress with her obesity treatment plan along with follow-up of her obesity related diagnoses.   Today's visit was #: 15 Starting weight: 248 lbs Starting date: 01/28/2019 Today's weight: 231 lbs Today's date: 11/18/2019 Total lbs lost to date: 17 lbs Body mass index is 36.18 kg/m.  Total weight loss percentage to date: -6.85%  Interim History: Megan Hanna says she recently got married in [place], and had a great time!  She says she had "way too much fun".  They are actively trying to conceive.  Megan Hanna has prediabetes and history of gestational diabetes.  She has a history of Clomid therapy for a few months.  Questionable PCOS.  Followed by Dr. Cherly Hensen.  Nutrition Plan: the Pescatarian Plan.  Anti-obesity medications: Phentermine 37.5 mg daily. Reported side effects: None. Hunger is well controlled controlled. Cravings are moderately controlled controlled.  Activity: Swimming and walking for 2 hours 4 times per week.  Assessment/Plan:   1. Prediabetes With history suspicious for PCOS.  Increase to therapeutic dose.  Goal is HgbA1c < 5.7.  Medication: metformin.  She will continue to focus on protein-rich, low simple carbohydrate foods. We reviewed the importance of hydration, regular exercise for stress reduction, and restorative sleep.   Lab Results  Component Value Date   HGBA1C 5.7 (H) 04/28/2019   Lab Results  Component Value Date   INSULIN 22.7 01/28/2019   - Start metFORMIN (GLUCOPHAGE) 850 MG tablet; Take 1 tablet (850 mg total) by mouth 2 (two) times daily with a meal.  Dispense: 60 tablet; Refill: 0   2. History of infertility Follwed by Dr. Cherly Hensen.  Trying to conceive.  Needed Clomid in the past.  Question PCOS.  3. Polyphagia Hyperphagia, also called polyphagia, refers to excessive feelings of hunger. This is more likely to be an issues for people that have diabetes, prediabetes, or insulin  resistance. She will continue to focus on protein-rich, low simple carbohydrate foods. We reviewed the importance of hydration, regular exercise for stress reduction, and restorative sleep.  We discussed that phentermine is not safe for pregnancy.  - Refill phentermine (ADIPEX-P) 37.5 MG tablet; Take 1 tablet (37.5 mg total) by mouth daily before breakfast.  Dispense: 60 tablet; Refill: 0  This patient 1) has no evidence of serious cardiovascular disease; 2) does not have serious psychiatric disease or a history of substance abuse; 3) has been informed about weight loss medications that are FDA-approved for long term use and told that these have been documented to be safe and effective; 4) does not demonstrate a clinically significant increase in pulse or BP when taking phentermine; and 5) demonstrates significant weight loss while using the medication. Patient understands that all anti-obesity medications are contraindicated in pregnancy.   The potential risks and benefits of phentermine have been reviewed with the patient, and alternative treatment options were discussed. All questions were answered, and the patient wishes to move forward with this medication.  I have consulted the [place] Controlled Substances Registry for this patient, and feel the risk/benefit ratio today is favorable for proceeding with this prescription for a controlled substance. The patient understands monitoring parameters and red flags.   4. Class 2 severe obesity with serious comorbidity and body mass index (BMI) of 36.0 to 36.9 in adult, unspecified obesity type (HCC)  Course: Megan Hanna is currently in the action stage of change. As such, her goal is to continue with weight loss efforts.   Nutrition  goals: She has agreed to the [employer].   Exercise goals: For substantial health benefits, adults should do at least 150 minutes (2 hours and 30 minutes) a week of moderate-intensity, or 75 minutes (1 hour and 15 minutes) a  week of vigorous-intensity aerobic physical activity, or an equivalent combination of moderate- and vigorous-intensity aerobic activity. Aerobic activity should be performed in episodes of at least 10 minutes, and preferably, it should be spread throughout the week.  Behavioral modification strategies: increasing lean protein intake, decreasing simple carbohydrates, increasing vegetables, increasing water intake and decreasing liquid calories.  Shavonta has agreed to follow-up with our clinic in 3 weeks. She was informed of the importance of frequent follow-up visits to maximize her success with intensive lifestyle modifications for her multiple health conditions.   Objective:   Blood pressure 135/85, pulse 68, temperature 98 F (36.7 C), height 5\' 7"  (1.702 m), weight 231 lb (104.8 kg), SpO2 99 %. Body mass index is 36.18 kg/m.  General: Cooperative, alert, well developed, in no acute distress. HEENT: Conjunctivae and lids unremarkable. Cardiovascular: Regular rhythm.  Lungs: Normal work of breathing. Neurologic: No focal deficits.   Lab Results  Component Value Date   CREATININE 0.85 01/28/2019   BUN 13 01/28/2019   NA 139 01/28/2019   K 4.7 01/28/2019   CL 102 01/28/2019   CO2 21 01/28/2019   Lab Results  Component Value Date   ALT 16 01/28/2019   AST 17 01/28/2019   ALKPHOS 83 01/28/2019   BILITOT 0.3 01/28/2019   Lab Results  Component Value Date   HGBA1C 5.7 (H) 04/28/2019   HGBA1C 5.9 (H) 01/28/2019   Lab Results  Component Value Date   INSULIN 22.7 01/28/2019   Lab Results  Component Value Date   TSH 2.550 01/28/2019   Lab Results  Component Value Date   CHOL 184 01/28/2019   HDL 46 01/28/2019   LDLCALC 123 (H) 01/28/2019   TRIG 82 01/28/2019   Lab Results  Component Value Date   WBC 5.0 01/28/2019   HGB 13.7 01/28/2019   HCT 42.6 01/28/2019   MCV 87 01/28/2019   PLT 418 01/28/2019   Lab Results  Component Value Date   IRON 51 01/28/2019   TIBC  283 01/28/2019   FERRITIN 136 01/28/2019   Attestation Statements:   Reviewed by clinician on day of visit: allergies, medications, problem list, medical history, surgical history, family history, social history, and previous encounter notes.  I, 01/30/2019, CMA, am acting as transcriptionist for Insurance claims handler, DO  I have reviewed the above documentation for accuracy and completeness, and I agree with the above. Helane Rima, DO

## 2019-12-09 ENCOUNTER — Encounter (INDEPENDENT_AMBULATORY_CARE_PROVIDER_SITE_OTHER): Payer: Self-pay | Admitting: Family Medicine

## 2019-12-09 ENCOUNTER — Other Ambulatory Visit: Payer: Self-pay

## 2019-12-09 ENCOUNTER — Ambulatory Visit (INDEPENDENT_AMBULATORY_CARE_PROVIDER_SITE_OTHER): Payer: 59 | Admitting: Family Medicine

## 2019-12-09 VITALS — BP 114/75 | HR 101 | Temp 98.1°F | Ht 67.0 in | Wt 227.0 lb

## 2019-12-09 DIAGNOSIS — Z9189 Other specified personal risk factors, not elsewhere classified: Secondary | ICD-10-CM

## 2019-12-09 DIAGNOSIS — R632 Polyphagia: Secondary | ICD-10-CM | POA: Diagnosis not present

## 2019-12-09 DIAGNOSIS — E8881 Metabolic syndrome: Secondary | ICD-10-CM | POA: Diagnosis not present

## 2019-12-09 DIAGNOSIS — Z6835 Body mass index (BMI) 35.0-35.9, adult: Secondary | ICD-10-CM

## 2019-12-09 DIAGNOSIS — E559 Vitamin D deficiency, unspecified: Secondary | ICD-10-CM | POA: Diagnosis not present

## 2019-12-09 MED ORDER — PHENTERMINE HCL 37.5 MG PO TABS: 37.5000 mg | ORAL_TABLET | Freq: Every day | ORAL | 0 refills | Status: DC

## 2019-12-10 NOTE — Progress Notes (Signed)
Chief Complaint:   OBESITY Megan Hanna is here to discuss her progress with her obesity treatment plan along with follow-up of her obesity related diagnoses.   Today's visit was #: 16 Starting weight: 248 lbs Starting date: 01/28/2019 Today's weight: 227 lbs Today's date: 12/09/2019 Total lbs lost to date: 21 lbs Body mass index is 35.55 kg/m.  Total weight loss percentage to date: -8.47%  Interim History: Megan Hanna will see Dr. Cherly Hensen on Thursday.  She says that the increase in metformin caused her to be nauseated and constipated.  She will go back to the other dose. Nutrition Plan: the Pescatarian Plan for 60% of the time.  Anti-obesity medications: metformin 850 mg twice daily mg daily and phentermine 37.5 mg daily. Reported side effects:  Nausea and constipation. Activity: Boxing/walking for 30 minutes 2 times per week.  Assessment/Plan:   1. Insulin resistance Improving, but not optimized. Goal is HgbA1c < 5.7, fasting insulin closer to 5.  Medication: metformin 850 mg twice daily.  She will continue to focus on protein-rich, low simple carbohydrate foods. We reviewed the importance of hydration, regular exercise for stress reduction, and restorative sleep.   Plan:  Change metformin to 1,000 mg daily due to nausea and constipation on increased dose.  Lab Results  Component Value Date   HGBA1C 5.7 (H) 04/28/2019   Lab Results  Component Value Date   INSULIN 22.7 01/28/2019   2. Polyphagia Improved. She will continue to focus on protein-rich, low simple carbohydrate foods. We reviewed the importance of hydration, regular exercise for stress reduction, and restorative sleep.  - Refill phentermine (ADIPEX-P) 37.5 MG tablet; Take 1 tablet (37.5 mg total) by mouth daily before breakfast.  Dispense: 60 tablet; Refill: 0  This patient 1) has no evidence of serious cardiovascular disease; 2) does not have serious psychiatric disease or a history of substance abuse; 3) has been  informed about weight loss medications that are FDA-approved for long term use and told that these have been documented to be safe and effective; 4) does not demonstrate a clinically significant increase in pulse or BP when taking phentermine; and 5) demonstrates significant weight loss while using the medication. Patient understands that all anti-obesity medications are contraindicated in pregnancy. Pt denies a history of glaucoma.   Patient understands that long-term use of phentermine is considered off-label use of this medication, however, that the Endocrine Society and recent research supports that long-term use of phentermine does not appear to have detrimental health effects when used in the appropriate patient. In addition, a 2019 study published in Obesity Journal on 13,972 patients concluded that "recommendations to limit phentermine to less than 3 months do not align with current concepts of pharmacologic treatment of obesity", and that "long term phentermine users experience greater weight loss without apparent increases in cardiovascular risk".  The potential risks and benefits of phentermine have been reviewed with the patient, and alternative treatment options were discussed. All questions were answered, and the patient wishes to move forward with this medication.  I have consulted the [place] Controlled Substances Registry for this patient, and feel the risk/benefit ratio today is favorable for proceeding with this prescription for a controlled substance. The patient understands monitoring parameters and red flags.   3. Vitamin D deficiency Not at goal. Current vitamin D is 29.7, tested on 04/28/2019. Optimal goal > 50 ng/dL.   Plan:  [x]   Continue Vitamin D @50 ,000 IU every week. [x]   Continue home supplement daily. [x]   Follow-up  for routine testing of Vitamin D at least 2-3 times per year to avoid over-replacement.  4. At risk for nausea Megan Hanna was given approximately 8 minutes  of nausea prevention counseling today. Megan Hanna is at risk for nausea due to metformin.  She was encouraged to titrate her medication slowly, make sure to stay hydrated, eat smaller portions throughout the day, and avoid high fat meals.   5. Class 2 severe obesity with serious comorbidity and body mass index (BMI) of 35.0 to 35.9 in adult, unspecified obesity type (HCC)  Course: Megan Hanna is currently in the action stage of change. As such, her goal is to continue with weight loss efforts.   Nutrition goals: She has agreed to the [employer].   Exercise goals: For substantial health benefits, adults should do at least 150 minutes (2 hours and 30 minutes) a week of moderate-intensity, or 75 minutes (1 hour and 15 minutes) a week of vigorous-intensity aerobic physical activity, or an equivalent combination of moderate- and vigorous-intensity aerobic activity. Aerobic activity should be performed in episodes of at least 10 minutes, and preferably, it should be spread throughout the week.  Behavioral modification strategies: increasing lean protein intake, decreasing simple carbohydrates, increasing vegetables and increasing water intake.  Megan Hanna has agreed to follow-up with our clinic in 4 weeks. She was informed of the importance of frequent follow-up visits to maximize her success with intensive lifestyle modifications for her multiple health conditions.   Objective:   Blood pressure 114/75, pulse (!) 101, temperature 98.1 F (36.7 C), temperature source Oral, height 5\' 7"  (1.702 m), weight 227 lb (103 kg), SpO2 97 %. Body mass index is 35.55 kg/m.  General: Cooperative, alert, well developed, in no acute distress. HEENT: Conjunctivae and lids unremarkable. Cardiovascular: Regular rhythm.  Lungs: Normal work of breathing. Neurologic: No focal deficits.   Lab Results  Component Value Date   CREATININE 0.85 01/28/2019   BUN 13 01/28/2019   NA 139 01/28/2019   K 4.7 01/28/2019   CL  102 01/28/2019   CO2 21 01/28/2019   Lab Results  Component Value Date   ALT 16 01/28/2019   AST 17 01/28/2019   ALKPHOS 83 01/28/2019   BILITOT 0.3 01/28/2019   Lab Results  Component Value Date   HGBA1C 5.7 (H) 04/28/2019   HGBA1C 5.9 (H) 01/28/2019   Lab Results  Component Value Date   INSULIN 22.7 01/28/2019   Lab Results  Component Value Date   TSH 2.550 01/28/2019   Lab Results  Component Value Date   CHOL 184 01/28/2019   HDL 46 01/28/2019   LDLCALC 123 (H) 01/28/2019   TRIG 82 01/28/2019   Lab Results  Component Value Date   WBC 5.0 01/28/2019   HGB 13.7 01/28/2019   HCT 42.6 01/28/2019   MCV 87 01/28/2019   PLT 418 01/28/2019   Lab Results  Component Value Date   IRON 51 01/28/2019   TIBC 283 01/28/2019   FERRITIN 136 01/28/2019   Attestation Statements:   Reviewed by clinician on day of visit: allergies, medications, problem list, medical history, surgical history, family history, social history, and previous encounter notes.  I, 01/30/2019, CMA, am acting as transcriptionist for Insurance claims handler, DO  I have reviewed the above documentation for accuracy and completeness, and I agree with the above. Helane Rima, DO

## 2020-01-08 ENCOUNTER — Ambulatory Visit (INDEPENDENT_AMBULATORY_CARE_PROVIDER_SITE_OTHER): Payer: 59 | Admitting: Family Medicine

## 2020-01-08 ENCOUNTER — Other Ambulatory Visit: Payer: Self-pay

## 2020-01-08 ENCOUNTER — Encounter (INDEPENDENT_AMBULATORY_CARE_PROVIDER_SITE_OTHER): Payer: Self-pay | Admitting: Family Medicine

## 2020-01-08 VITALS — BP 109/74 | HR 87 | Temp 98.1°F | Ht 67.0 in | Wt 229.0 lb

## 2020-01-08 DIAGNOSIS — Z9189 Other specified personal risk factors, not elsewhere classified: Secondary | ICD-10-CM | POA: Diagnosis not present

## 2020-01-08 DIAGNOSIS — Z6836 Body mass index (BMI) 36.0-36.9, adult: Secondary | ICD-10-CM

## 2020-01-08 DIAGNOSIS — R632 Polyphagia: Secondary | ICD-10-CM | POA: Diagnosis not present

## 2020-01-08 DIAGNOSIS — E559 Vitamin D deficiency, unspecified: Secondary | ICD-10-CM | POA: Diagnosis not present

## 2020-01-08 DIAGNOSIS — R7303 Prediabetes: Secondary | ICD-10-CM

## 2020-01-08 MED ORDER — PHENTERMINE HCL 37.5 MG PO TABS: 37.5000 mg | ORAL_TABLET | Freq: Every day | ORAL | 0 refills | Status: DC

## 2020-01-14 NOTE — Progress Notes (Signed)
Chief Complaint:   OBESITY Megan Hanna is here to discuss her progress with her obesity treatment plan along with follow-up of her obesity related diagnoses.   Today's visit was #: 17 Starting weight: 248 lbs Starting date: 01/28/2019 Today's weight: 229 lbs Today's date: 01/08/2020 Total lbs lost to date: 19 lbs Body mass index is 35.87 kg/m.  Total weight loss percentage to date: -7.66%  Interim History: Megan Hanna says she had higher calorie foods during the holidays, though she still worked on controlling her portion sizes.   New goal:  Boxing 3 days per week along with walking. Nutrition Plan: the Pescatarian Plan for 50% of the time..  Anti-obesity medications: phentermine 37.5 mg daily. Reported side effects: None. Hunger is well controlled. Cravings are well controlled.  Activity: Boxing for 30 minutes once a week.  Assessment/Plan:   1. Polyphagia Controlled.  She will continue to focus on protein-rich, low simple carbohydrate foods. We reviewed the importance of hydration, regular exercise for stress reduction, and restorative sleep.  - Refill phentermine (ADIPEX-P) 37.5 MG tablet; Take 1 tablet (37.5 mg total) by mouth daily before breakfast.  Dispense: 30 tablet; Refill: 0  This patient 1) has no evidence of serious cardiovascular disease; 2) does not have serious psychiatric disease or a history of substance abuse; 3) has been informed about weight loss medications that are FDA-approved for long term use and told that these have been documented to be safe and effective; 4) does not demonstrate a clinically significant increase in pulse or BP when taking phentermine; and 5) demonstrates significant weight loss while using the medication. Patient understands that all anti-obesity medications are contraindicated in pregnancy. Pt denies a history of glaucoma.   Patient understands that long-term use of phentermine is considered off-label use of this medication, however, that the  Endocrine Society and recent research supports that long-term use of phentermine does not appear to have detrimental health effects when used in the appropriate patient. In addition, a 2019 study published in Obesity Journal on 13,972 patients concluded that "recommendations to limit phentermine to less than 3 months do not align with current concepts of pharmacologic treatment of obesity", and that "long term phentermine users experience greater weight loss without apparent increases in cardiovascular risk".  The potential risks and benefits of phentermine have been reviewed with the patient, and alternative treatment options were discussed. All questions were answered, and the patient wishes to move forward with this medication.  I have consulted the [place] Controlled Substances Registry for this patient, and feel the risk/benefit ratio today is favorable for proceeding with this prescription for a controlled substance. The patient understands monitoring parameters and red flags.   2. Vitamin D deficiency Improving, but not optimized. Current vitamin D is 29.7, tested on 04/28/2019. Optimal goal > 50 ng/dL.   Plan:  [x]   Continue Vitamin D @50 ,000 IU every week. []   Continue home supplement daily. [x]   Follow-up for routine testing of Vitamin D at least 2-3 times per year to avoid over-replacement.  3. Prediabetes Improving, but not optimized. Goal is HgbA1c < 5.7.  Medication: metformin 1,000 mg daily.  She will continue to focus on protein-rich, low simple carbohydrate foods. We reviewed the importance of hydration, regular exercise for stress reduction, and restorative sleep.   Lab Results  Component Value Date   HGBA1C 5.7 (H) 04/28/2019   Lab Results  Component Value Date   INSULIN 22.7 01/28/2019   4. At risk for diabetes mellitus Megan Hanna was  given approximately 8 minutes of diabetes education and counseling today. We discussed intensive lifestyle modifications today with an emphasis on  weight loss as well as increasing exercise and decreasing simple carbohydrates in her diet. We also reviewed medication options with an emphasis on risk versus benefit of those discussed. Repetitive spaced learning was employed today to elicit superior memory formation and behavioral change.  5. Class 2 severe obesity with serious comorbidity and body mass index (BMI) of 36.0 to 36.9 in adult, unspecified obesity type (HCC)  Course: Megan Hanna is currently in the action stage of change. As such, her goal is to continue with weight loss efforts.   Nutrition goals: She has agreed to the [employer].   Exercise goals: For substantial health benefits, adults should do at least 150 minutes (2 hours and 30 minutes) a week of moderate-intensity, or 75 minutes (1 hour and 15 minutes) a week of vigorous-intensity aerobic physical activity, or an equivalent combination of moderate- and vigorous-intensity aerobic activity. Aerobic activity should be performed in episodes of at least 10 minutes, and preferably, it should be spread throughout the week. Increase boxing to 3 times per week.  Behavioral modification strategies: increasing lean protein intake, decreasing simple carbohydrates, increasing vegetables, increasing water intake, decreasing liquid calories, decreasing alcohol intake, decreasing sodium intake and increasing high fiber foods.  Megan Hanna has agreed to follow-up with our clinic in 3 weeks. She was informed of the importance of frequent follow-up visits to maximize her success with intensive lifestyle modifications for her multiple health conditions.   Objective:   Blood pressure 109/74, pulse 87, temperature 98.1 F (36.7 C), temperature source Oral, height 5\' 7"  (1.702 m), weight 229 lb (103.9 kg), SpO2 96 %. Body mass index is 35.87 kg/m.  General: Cooperative, alert, well developed, in no acute distress. HEENT: Conjunctivae and lids unremarkable. Cardiovascular: Regular rhythm.   Lungs: Normal work of breathing. Neurologic: No focal deficits.   Lab Results  Component Value Date   CREATININE 0.85 01/28/2019   BUN 13 01/28/2019   NA 139 01/28/2019   K 4.7 01/28/2019   CL 102 01/28/2019   CO2 21 01/28/2019   Lab Results  Component Value Date   ALT 16 01/28/2019   AST 17 01/28/2019   ALKPHOS 83 01/28/2019   BILITOT 0.3 01/28/2019   Lab Results  Component Value Date   HGBA1C 5.7 (H) 04/28/2019   HGBA1C 5.9 (H) 01/28/2019   Lab Results  Component Value Date   INSULIN 22.7 01/28/2019   Lab Results  Component Value Date   TSH 2.550 01/28/2019   Lab Results  Component Value Date   CHOL 184 01/28/2019   HDL 46 01/28/2019   LDLCALC 123 (H) 01/28/2019   TRIG 82 01/28/2019   Lab Results  Component Value Date   WBC 5.0 01/28/2019   HGB 13.7 01/28/2019   HCT 42.6 01/28/2019   MCV 87 01/28/2019   PLT 418 01/28/2019   Lab Results  Component Value Date   IRON 51 01/28/2019   TIBC 283 01/28/2019   FERRITIN 136 01/28/2019   Attestation Statements:   Reviewed by clinician on day of visit: allergies, medications, problem list, medical history, surgical history, family history, social history, and previous encounter notes.  I, 01/30/2019, CMA, am acting as transcriptionist for Insurance claims handler, DO  I have reviewed the above documentation for accuracy and completeness, and I agree with the above. Helane Rima, DO

## 2020-01-29 ENCOUNTER — Ambulatory Visit (INDEPENDENT_AMBULATORY_CARE_PROVIDER_SITE_OTHER): Payer: 59 | Admitting: Family Medicine

## 2020-10-06 ENCOUNTER — Inpatient Hospital Stay (HOSPITAL_COMMUNITY)
Admission: AD | Admit: 2020-10-06 | Discharge: 2020-10-06 | Disposition: A | Discharge status: Home / Self Care | Payer: BC Managed Care – PPO | Attending: Obstetrics and Gynecology | Admitting: Obstetrics and Gynecology

## 2020-10-06 ENCOUNTER — Encounter (HOSPITAL_COMMUNITY): Payer: Self-pay | Admitting: Obstetrics and Gynecology

## 2020-10-06 ENCOUNTER — Other Ambulatory Visit: Payer: Self-pay

## 2020-10-06 ENCOUNTER — Inpatient Hospital Stay (HOSPITAL_COMMUNITY): Payer: BC Managed Care – PPO

## 2020-10-06 DIAGNOSIS — N8311 Corpus luteum cyst of right ovary: Secondary | ICD-10-CM | POA: Insufficient documentation

## 2020-10-06 DIAGNOSIS — O09521 Supervision of elderly multigravida, first trimester: Secondary | ICD-10-CM | POA: Insufficient documentation

## 2020-10-06 DIAGNOSIS — Z3A01 Less than 8 weeks gestation of pregnancy: Secondary | ICD-10-CM | POA: Diagnosis not present

## 2020-10-06 DIAGNOSIS — O3481 Maternal care for other abnormalities of pelvic organs, first trimester: Secondary | ICD-10-CM | POA: Insufficient documentation

## 2020-10-06 DIAGNOSIS — O020 Blighted ovum and nonhydatidiform mole: Secondary | ICD-10-CM

## 2020-10-06 DIAGNOSIS — Z3490 Encounter for supervision of normal pregnancy, unspecified, unspecified trimester: Secondary | ICD-10-CM

## 2020-10-06 LAB — COMPREHENSIVE METABOLIC PANEL
ALT: 22 U/L (ref 0–44)
AST: 18 U/L (ref 15–41)
Albumin: 3.3 g/dL — ABNORMAL LOW (ref 3.5–5.0)
Alkaline Phosphatase: 57 U/L (ref 38–126)
Anion gap: 9 (ref 5–15)
BUN: 10 mg/dL (ref 6–20)
CO2: 24 mmol/L (ref 22–32)
Calcium: 9 mg/dL (ref 8.9–10.3)
Chloride: 103 mmol/L (ref 98–111)
Creatinine, Ser: 0.89 mg/dL (ref 0.44–1.00)
GFR, Estimated: >60 mL/min (ref >60)
Glucose, Bld: 103 mg/dL — ABNORMAL HIGH (ref 70–99)
Potassium: 4.2 mmol/L (ref 3.5–5.1)
Sodium: 136 mmol/L (ref 135–145)
Total Bilirubin: 0.6 mg/dL (ref 0.3–1.2)
Total Protein: 7.6 g/dL (ref 6.5–8.1)

## 2020-10-06 LAB — CBC
HCT: 40.3 % (ref 36.0–46.0)
Hemoglobin: 13 g/dL (ref 12.0–15.0)
MCH: 28 pg (ref 26.0–34.0)
MCHC: 32.3 g/dL (ref 30.0–36.0)
MCV: 86.9 fL (ref 80.0–100.0)
Platelets: 365 10*3/uL (ref 150–400)
RBC: 4.64 MIL/uL (ref 3.87–5.11)
RDW: 14.3 % (ref 11.5–15.5)
WBC: 4.6 10*3/uL (ref 4.0–10.5)
nRBC: 0 % (ref 0.0–0.2)

## 2020-10-06 LAB — HCG, QUANTITATIVE, PREGNANCY: hCG, Beta Chain, Quant, S: 233 m[IU]/mL — ABNORMAL HIGH (ref <5)

## 2020-10-06 MED ORDER — MISOPROSTOL 200 MCG PO TABS
800.0000 ug | ORAL_TABLET | Freq: Once | ORAL | Status: AC
  Administered 2020-10-06: 800 ug via ORAL
  Filled 2020-10-06: qty 4

## 2020-10-06 MED ORDER — OXYCODONE-ACETAMINOPHEN 5-325 MG PO TABS: 1.0000 | ORAL_TABLET | Freq: Four times a day (QID) | ORAL | 0 refills | Status: AC | PRN

## 2020-10-06 MED ORDER — ONDANSETRON HCL 4 MG PO TABS: 4.0000 mg | ORAL_TABLET | Freq: Three times a day (TID) | ORAL | 0 refills | Status: AC | PRN

## 2020-10-06 NOTE — MAU Note (Signed)
.  Megan Hanna is a 40 y.o. at [redacted]w[redacted]d here in MAU reporting: sent from MD office for r/o ectopic for abnormal rise in [place]. Denies VB or abnormal discharge. Is having back pain she rates 6/10, that is normal for her.   Vitals:   10/06/20 1004  BP: 137/88  Pulse: 91  Resp: 15  Temp: 99.2 F (37.3 C)  SpO2: 100%

## 2020-10-06 NOTE — MAU Provider Note (Signed)
History    Cc: abnormal Hquant  HPI: 40 yo [id number] MBF  sent for evaluation for possible ectopic pregnancy due to abnormal hquants( not rising 300's to 400's). Pt denies abdominal pain.  Pt has had some vaginal spotting   OB History     Gravida  4   Para  1   Term  1   Preterm      AB  2   Living  1      SAB  2   IAB      Ectopic      Multiple      Live Births  1           Past Medical History:  Diagnosis Date   Anemia    Back pain    Generalized abdominal pain 11/07/2016   GERD (gastroesophageal reflux disease)    Hematemesis with nausea 11/07/2016   Hyperlipidemia 11/07/2016   Insomnia 11/07/2016   Joint pain    Obesity 11/07/2016   Palpitation 11/06/2016   Prediabetes     Past Surgical History:  Procedure Laterality Date   CYST REMOVAL HAND Left     Family History  Problem Relation Age of Onset   Hypertension Mother    Hyperlipidemia Mother    Obesity Mother    Diabetes Father    Hypertension Father    Hyperlipidemia Father    Obesity Father    Breast cancer Maternal Grandmother        remission   Heart disease Maternal Grandfather    Heart attack Maternal Grandfather    Heart disease Paternal Grandfather     Social History   Tobacco Use   Smoking status: Never   Smokeless tobacco: Never  Substance Use Topics   Alcohol use: Yes    Comment: occ   Drug use: Never    Allergies: No Known Allergies  Medications Prior to Admission  Medication Sig Dispense Refill Last Dose   metFORMIN (GLUCOPHAGE) 1000 MG tablet Take 1,000 mg by mouth daily with breakfast.      phentermine (ADIPEX-P) 37.5 MG tablet Take 1 tablet (37.5 mg total) by mouth daily before breakfast. 30 tablet 0    Vitamin D, Ergocalciferol, (DRISDOL) 1.25 MG (50000 UNIT) CAPS capsule Take 1 capsule (50,000 Units total) by mouth every 7 (seven) days. 4 capsule 0      Physical Exam   Blood pressure 137/88, pulse 91, temperature 99.2 F (37.3 C), temperature source Oral,  resp. rate 15, last menstrual period 08/22/2020, SpO2 100 %.  General appearance: alert, cooperative, and no distress Lungs: clear to auscultation bilaterally Heart: regular rate and rhythm, S1, S2 normal, no murmur, click, rub or gallop Abdomen: soft, non-tender; bowel sounds normal; no masses,  no organomegaly Pelvic: cervix normal in appearance, external genitalia normal, no adnexal masses or tenderness, no cervical motion tenderness, uterus normal size, shape, and consistency, and vagina normal without discharge  Extremities: no edema, redness or tenderness in the calves or thighs Skin: Skin color, texture, turgor normal. No rashes or lesions ED Course  IMP: abnormal Hquant Presumed ectopic pregnancy P)pelvic sonogram. Ectopic MTX protocol. Await sonogram result and labs MDM   Serita Kyle, MD 10:51 AM 10/06/2020   Addendum US OB LESS THAN 14 WEEKS WITH OB TRANSVAGINAL  Result Date: 10/06/2020 CLINICAL DATA:  First trimester pregnancy, LMP 08/22/2020 EXAM: OBSTETRIC <14 WK [place] AND TRANSVAGINAL OB US TECHNIQUE: Both transabdominal and transvaginal ultrasound examinations were performed for complete evaluation of the gestation as  well as the maternal uterus, adnexal regions, and pelvic cul-de-sac. Transvaginal technique was performed to assess early pregnancy. COMPARISON:  None FINDINGS: Intrauterine gestational sac: Present, single Yolk sac:  Absent Embryo:  Absent Cardiac Activity: N/A Heart Rate: N/A  bpm MSD: 4.7 mm   5 w   1 d Subchorionic hemorrhage:  None visualized. Maternal uterus/adnexae: LEFT ovary normal size and morphology, 3.1 x 1.9 x 2.1 cm. RIGHT ovary normal size and morphology, 2.8 x 1.7 x 2.6 cm, containing a tiny corpus luteal cyst. Trace free pelvic fluid. Anteverted uterus without additional abnormality. No adnexal masses. IMPRESSION: Tiny gestational sac seen within the uterus, size corresponding to 5 weeks 1 day EGA. No fetal pole identified to establish  viability; may consider follow-up ultrasound in 14 days to establish viability if clinically indicated. Electronically Signed   By: Ulyses Southward M.D.   On: 10/06/2020 12:01      Reviewed sono with patient. Given the hquant today and prior levels, this is c/w blighted ovum. Abnormal intrauterine pregnancy. Would not repeat sonogram. Pt agrees. Will proceed with cytotec ( oral). Script sent for pain mgmt. F/u office in 2 wk. SAB precautions

## 2021-08-10 ENCOUNTER — Encounter (INDEPENDENT_AMBULATORY_CARE_PROVIDER_SITE_OTHER): Payer: Self-pay

## 2023-07-11 ENCOUNTER — Ambulatory Visit (INDEPENDENT_AMBULATORY_CARE_PROVIDER_SITE_OTHER): Payer: BC Managed Care – PPO | Admitting: Otolaryngology

## 2023-11-08 ENCOUNTER — Encounter (HOSPITAL_COMMUNITY): Payer: Self-pay | Admitting: Internal Medicine

## 2023-11-08 DIAGNOSIS — R002 Palpitations: Secondary | ICD-10-CM

## 2023-11-08 DIAGNOSIS — R9431 Abnormal electrocardiogram [ECG] [EKG]: Secondary | ICD-10-CM

## 2024-01-06 ENCOUNTER — Encounter: Payer: Self-pay | Admitting: Internal Medicine
